# Patient Record
Sex: Female | Born: 1980 | Race: Black or African American | Hispanic: No | Marital: Single | Smoking: Former smoker
Health system: Southern US, Community
[De-identification: ages and names within clinical notes are randomized; demographics above are authoritative.]

## PROBLEM LIST (undated history)

## (undated) DIAGNOSIS — F329 Major depressive disorder, single episode, unspecified: Secondary | ICD-10-CM

## (undated) DIAGNOSIS — I10 Essential (primary) hypertension: Secondary | ICD-10-CM

## (undated) DIAGNOSIS — F419 Anxiety disorder, unspecified: Secondary | ICD-10-CM

## (undated) DIAGNOSIS — F32A Depression, unspecified: Secondary | ICD-10-CM

## (undated) HISTORY — DX: Anxiety disorder, unspecified: F41.9

## (undated) HISTORY — DX: Essential (primary) hypertension: I10

---

## 2006-10-21 ENCOUNTER — Emergency Department (HOSPITAL_COMMUNITY): Admission: EM | Admit: 2006-10-21 | Discharge: 2006-10-22 | Payer: Self-pay | Admitting: Emergency Medicine

## 2007-06-15 ENCOUNTER — Other Ambulatory Visit (HOSPITAL_COMMUNITY): Admission: RE | Admit: 2007-06-15 | Discharge: 2007-07-13 | Payer: Self-pay | Admitting: Psychiatry

## 2007-06-20 ENCOUNTER — Ambulatory Visit: Payer: Self-pay | Admitting: Psychiatry

## 2007-07-13 ENCOUNTER — Ambulatory Visit: Payer: Self-pay | Admitting: Psychiatry

## 2007-07-28 ENCOUNTER — Inpatient Hospital Stay (HOSPITAL_COMMUNITY): Admission: EM | Admit: 2007-07-28 | Discharge: 2007-07-30 | Payer: Self-pay | Admitting: *Deleted

## 2007-07-29 ENCOUNTER — Ambulatory Visit: Payer: Self-pay | Admitting: *Deleted

## 2010-07-22 NOTE — H&P (Signed)
NAME:  Nancy Mcgrath, Nancy Mcgrath NO.:  [id number]   MEDICAL RECORD NO.:  [id number]          PATIENT TYPE:  IPS   LOCATION:  0304                          FACILITY:  [facility]   PHYSICIAN:  Jasmine Pang, M.D. DATE OF BIRTH:  [birth year]   DATE OF ADMISSION:  07/28/2007  DATE OF DISCHARGE:                       PSYCHIATRIC ADMISSION ASSESSMENT   HISTORY OF PRESENT ILLNESS:  The patient presents with a history of  depression and having passive suicidal thoughts, stating that she does  not know how much more she can take of this.  Denies specific stressors  although she did state that her occupation is causing some stress for  her.  She states now that she looks back at her life she feels like she  has been depressed for years.  She reports feeling that she does not  know what to do with her life, trusting people and feels lost.  Also  feeling very vulnerable.  She reports difficulty sleeping.  She states  her brain will not shut off at night.  She denies any substance use.  She did have problems with medications recently, and states she has run  out of her medications.  She called one of her friends because she felt  her suicidal thoughts were increasing and felt unable to contract for  safety.  Had no specific plan.   PAST PSYCHIATRIC HISTORY:  First admission to the [facility] was in the IOP program for 15 days.  She has an appointment with  Dr. Milagros Evener next Friday on Aug 05, 2007.  Currently seeing a  counselor named Lottie Rater.  Has been on Wellbutrin in the past but  states that it made things worse.   SOCIAL HISTORY:  This 30 year old single female moved from [place].  She currently resides in [place].  She has no children.  She works as  a Copy at [employer].  Her family is in [place].  She moved  here to be independent.  She has a supportive friend.  Completed her GED  and then went to a community college for 1 year.  She states  she was in  a group home as a teenager for 2 years.   FAMILY HISTORY:  None.   SOCIAL HISTORY:  The patient smokes.  Denies any alcohol or drug use.  Primary care Camara Rosander is Cherylann Ratel.   MEDICAL PROBLEMS:  Was told that she had an elevated cholesterol at some  point in time but currently not on any medications.  Denies any other  acute or chronic health issues.   MEDICATIONS:  Takes Zoloft 50 mg daily, Ambien 10 mg at bedtime, and  trazodone 50.  Does get some sleep with medications.   DRUG ALLERGIES:  No known allergies.   REVIEW OF SYSTEMS:  She denies any fever, chills, chest pain, shortness  of breath.  No nausea, vomiting, dysuria.  No muscle problems.  No  falls.  No headaches.  Positive for depression, positive for anxiety.   Temperature is 97.8, 73 heart rate,  60 respirations, blood pressure is  106/70, 5 feet 2  inches tall, 147 pounds.  This is a well-nourished,  well-developed female.  HEAD:  Atraumatic.  There is no nasal drainage.  Sclerae are clear.  NECK:  No lymphadenopathy.  Neck is supple.  CHEST:  Clear, no wheezes or rales.  BREASTS:  Exam deferred.  HEART:  Regular rate and rhythm.  No murmurs, gallops or rubs.  ABDOMEN:  Soft, flat, nondistended abdomen.  Nontender.  PELVIC AND GU EXAM:  Deferred.  EXTREMITIES:  The patient moves all extremities, 5+ against resistance.  No clubbing or edema.  No deformities.  SKIN:  Warm and dry without rashes or lacerations noted.  NEUROLOGIC:  Findings are intact and nonfocal.   TSH is 2.221.  CMET within normal limits.  Urinalysis is negative.  Urine pregnancy test is negative.  CBC is within normal limits.   MENTAL STATUS EXAM:  She is a fully alert, cooperative and calm female,  casually dressed, neat in appearance.  Speech is clear, normal pace and  tone.  The patient's mood is depressed and patient is pleasant, has a  good sense of humor.  Her thought processes are coherent and goal  directed.  No active  suicidal thoughts and promises safety on the unit.  No evidence of any delusional thinking.  Cognitive function is oriented  x4.  Her memory is good.  Her judgment and insight are good.  She  appears sincere.   AXIS I:  Mood disorder, not otherwise specified.  AXIS II:  Deferred.  AXIS III:  No known acute or chronic health conditions.  AXIS IV:  Problems with occupation, other psychosocial problems.  AXIS V:  Current is 45.   PLANS:  Contract for safety.  Stabilize her mood and thinking.  Will  resume her Zoloft.  We will have Ambien available for sleep.  The  patient will need to continue with her individual therapy.  Will also  consider a family session with the patient's support group who she  identifies at this time is her friend.  Her estimated length of stay is  3-4 days.      Landry Corporal, N.P.      Jasmine Pang, M.D.  Electronically Signed    JO/MEDQ  D:  07/28/2007  T:  07/28/2007  Job:  [id number]

## 2010-07-22 NOTE — Discharge Summary (Signed)
Nancy Mcgrath, Nancy Mcgrath NO.:  [id number]   MEDICAL RECORD NO.:  [id number]          PATIENT TYPE:  IPS   LOCATION:  0304                          FACILITY:  [facility]   PHYSICIAN:  Jasmine Pang, M.D. DATE OF BIRTH:  21-Apr-1980   DATE OF ADMISSION:  07/28/2007  DATE OF DISCHARGE:  07/30/2007                               DISCHARGE SUMMARY   IDENTIFYING INFORMATION:  This is a 30 year old single African American  female who was admitted on a voluntary basis on Jul 28, 2007.   HISTORY OF PRESENT ILLNESS:  The patient has a history of depression.  She had passive suicidal ideation, do not know how much more I cannot  take of this.  She is unable to get out of bed.  She has felt depressed  for years.  She said she feels not knowing what to do with her life.  She feels vulnerable, feels lost.   PAST PSYCHIATRIC HISTORY:  This is the first [facility] admission for the  patient.  She was in IOP for 15 days, then missed 3 days and was  discharged.  She has an appointment with Dr. Evelene Croon, next week.  She has a  Veterinary surgeon, Lenoria Farrier.  She was on Wellbutrin in the past, which  made her feel worse she says.   FAMILY HISTORY:  None.   ALCOHOL AND DRUG HISTORY:  The patient smokes cigarettes.  She denies  any alcohol abuse or drug use.   MEDICAL PROBLEMS:  Increased cholesterol and no medication for this.   MEDICATION:  Zoloft 50 mg daily, Ambien 10 mg at h.s., and trazodone 50  mg at h.s.   DRUG ALLERGIES:  No known drug allergies.   PHYSICAL FINDINGS:  There were no acute physical or medical problems  noted.   ADMISSION LABORATORIES:  TSH was 2.221.  CMET was within normal limits.  Urinalysis was negative.  Urine pregnancy test negative.  CBC was within  normal limits.   HOSPITAL COURSE:  Upon admission, the patient started Zoloft 50 mg p.o.  q. day, Ambien 10 mg p.o. q.h.s., and trazodone 50 mg p.o. q.h.s.  On  Jul 29, 2007, her Zoloft was increased to 100 mg p.o.  q.a.m.  The  trazodone order was changed to p.r.n. dose.  The patient tolerated these  medications well with no significant side effects.  In individual  sessions with me, she was friendly and cooperative.  She also  participated appropriately in unit therapeutic groups and activities.  Therapeutic issues revolved around her feelings that she has been  depressed since childhood, but it had worsened recently.  She felt she  could not go home.  Stressors include work and conflict with friends.  She has a support from one friend.  She also had support from her mother  and a younger sister who lives in [place].  She had been on Chantix for  about 3 weeks, but discontinued this.  She was in the intensive  outpatient program, stopped this because she had missed 3 days and they  discharged her.  As hospitalization progressed, the patient  became less  depressed and less anxious.  As indicated above, Zoloft was increased to  100 mg daily.  The patient wanted to go home.  She had a very supportive  friend who was going away with her family for the [facility] Day weekend  and the patient wanted to go with him.  She assured me, she would be  safe.  On Jul 30, 2007, mood was less depressed and less anxious.  Affect consistent with mood.  There was no suicidal or homicidal  ideation.  No auditory or visual hallucinations.  No paranoia or  delusions.  Thoughts were logical and goal-directed.  Thought content,  no predominant theme.  Cognitive was grossly back to baseline.  The  patient wanted to leave today and was felt to be safe for discharge.   DISCHARGE DIAGNOSES:  AXIS I:  Depressive disorder, not otherwise  specified.  AXIS II:  None.  AXIS III:  Dyslipidemia.  AXIS IV:  Moderate (occupational problem, other psychosocial problems).  AXIS V:  Global assessment of functioning was 50 upon discharge.  GAF  was 40 upon admission.  GAF highest past year was 65-70.   DISCHARGE PLANS:  There are no  particular activity levels or dietary  restrictions.   POSTHOSPITAL CARE PLANS:  The patient will see Dr. Evelene Croon on Aug 05, 2007,  at 1 o'clock p.m.  She will also see Lenoria Farrier, her therapist on  Aug 04, 2007, at 2 p.m.   DISCHARGE MEDICATIONS:  1. Zoloft 100 mg p.o. q. day.  2. Ambien 10 mg p.o. q.h.s.  3. Trazodone 50 mg p.o. q.h.s. as needed for insomnia.      Jasmine Pang, M.D.  Electronically Signed     BHS/MEDQ  D:  07/30/2007  T:  07/30/2007  Job:  [id number]

## 2010-10-07 ENCOUNTER — Other Ambulatory Visit: Payer: Self-pay | Admitting: Otolaryngology

## 2010-10-07 DIAGNOSIS — H9319 Tinnitus, unspecified ear: Secondary | ICD-10-CM

## 2010-10-07 DIAGNOSIS — H905 Unspecified sensorineural hearing loss: Secondary | ICD-10-CM

## 2010-10-15 ENCOUNTER — Ambulatory Visit
Admission: RE | Admit: 2010-10-15 | Discharge: 2010-10-15 | Disposition: A | Discharge status: Home / Self Care | Payer: Medicare Other | Source: Ambulatory Visit | Attending: Otolaryngology | Admitting: Otolaryngology

## 2010-10-15 DIAGNOSIS — H905 Unspecified sensorineural hearing loss: Secondary | ICD-10-CM

## 2010-10-15 DIAGNOSIS — H9319 Tinnitus, unspecified ear: Secondary | ICD-10-CM

## 2010-10-15 MED ORDER — GADOBENATE DIMEGLUMINE 529 MG/ML IV SOLN
15.0000 mL | Freq: Once | INTRAVENOUS | Status: AC | PRN
  Administered 2010-10-15: 15 mL via INTRAVENOUS

## 2010-11-19 ENCOUNTER — Ambulatory Visit: Payer: Medicare Other | Admitting: Family Medicine

## 2010-12-03 LAB — DRUGS OF ABUSE SCREEN W/O ALC, ROUTINE URINE
Amphetamine Screen, Ur: NEGATIVE
Barbiturate Quant, Ur: NEGATIVE
Creatinine,U: 189.8
Marijuana Metabolite: NEGATIVE
Phencyclidine (PCP): NEGATIVE
Propoxyphene: NEGATIVE

## 2010-12-03 LAB — URINALYSIS, ROUTINE W REFLEX MICROSCOPIC
Bilirubin Urine: NEGATIVE
Nitrite: NEGATIVE
Specific Gravity, Urine: 1.017
Urobilinogen, UA: 1
pH: 6

## 2010-12-03 LAB — COMPREHENSIVE METABOLIC PANEL
ALT: 12
AST: 20
Alkaline Phosphatase: 42
CO2: 24
Calcium: 8.9
GFR calc Af Amer: >60
Potassium: 3.8
Sodium: 138
Total Protein: 7

## 2010-12-03 LAB — TSH: TSH: 2.221

## 2010-12-03 LAB — CBC
MCHC: 33.4
RBC: 4.42
RDW: 12.5

## 2010-12-03 LAB — PREGNANCY, URINE: Preg Test, Ur: NEGATIVE

## 2010-12-22 LAB — POCT PREGNANCY, URINE
Operator id: 27065
Preg Test, Ur: NEGATIVE

## 2011-02-23 ENCOUNTER — Emergency Department (HOSPITAL_COMMUNITY)
Admission: EM | Admit: 2011-02-23 | Discharge: 2011-02-23 | Disposition: A | Discharge status: Home / Self Care | Payer: Medicare Other | Attending: Emergency Medicine | Admitting: Emergency Medicine

## 2011-02-23 ENCOUNTER — Encounter: Payer: Self-pay | Admitting: Emergency Medicine

## 2011-02-23 DIAGNOSIS — R519 Headache, unspecified: Secondary | ICD-10-CM

## 2011-02-23 DIAGNOSIS — R51 Headache: Secondary | ICD-10-CM | POA: Insufficient documentation

## 2011-02-23 DIAGNOSIS — F17200 Nicotine dependence, unspecified, uncomplicated: Secondary | ICD-10-CM | POA: Insufficient documentation

## 2011-02-23 DIAGNOSIS — H9319 Tinnitus, unspecified ear: Secondary | ICD-10-CM | POA: Insufficient documentation

## 2011-02-23 DIAGNOSIS — H6690 Otitis media, unspecified, unspecified ear: Secondary | ICD-10-CM

## 2011-02-23 MED ORDER — AMOXICILLIN 500 MG PO CAPS: 500.0000 mg | ORAL_CAPSULE | Freq: Three times a day (TID) | ORAL | Status: AC

## 2011-02-23 MED ORDER — OXYCODONE-ACETAMINOPHEN 5-325 MG PO TABS
ORAL_TABLET | ORAL | Status: AC
  Filled 2011-02-23: qty 1

## 2011-02-23 MED ORDER — OXYCODONE-ACETAMINOPHEN 5-325 MG PO TABS
2.0000 | ORAL_TABLET | Freq: Once | ORAL | Status: AC
  Administered 2011-02-23: 2 via ORAL
  Filled 2011-02-23: qty 2

## 2011-02-23 MED ORDER — OXYCODONE-ACETAMINOPHEN 5-325 MG PO TABS: 1.0000 | ORAL_TABLET | ORAL | Status: AC | PRN

## 2011-02-23 NOTE — ED Provider Notes (Signed)
History     CSN: [id number] Arrival date & time: 02/23/2011  3:06 AM   First MD Initiated Contact with Patient 02/23/11 [phone]      Chief Complaint  Patient presents with  . Headache    (Consider location/radiation/quality/duration/timing/severity/associated sxs/prior treatment) HPI Comments: Patient is a 30 year old female with a history of intermittent right-sided facial pain. She states that currently she has a throbbing and sharp and stabbing right cheek pain. This started approximately 36 hours ago, is relatively constant, she is unsure if it makes it better or worse to chew because she has avoided eating secondary to the pain. She denies swelling of her cheek or her jaw, denies fevers, nausea, vomiting. She has no pain with opening her jaw and has no associated visual changes. She denies ataxia, focal numbness or weakness. She has had intermittent tinnitus in her left ear and has been evaluated for this by an ear nose and throat specialist as well as a neurologist. In July of this year she had a similar pain in a similar location prompting her followup visits to the specialist. 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 She had a spinal tap at the neurologist office which she reports had a normal opening pressure. Her pain in July went away spontaneously and came back yesterday. Currently her symptoms are moderate, not improved with tramadol and Naprosyn at home.  It was gradual in onset  Patient is a 30 y.o. female presenting with headaches. The history is provided by the patient and medical records.  Headache     History reviewed. No pertinent past medical history.  History reviewed. No pertinent past surgical history.  No family history on file.  History  Substance Use Topics  . Smoking status: Current Everyday Smoker  . Smokeless tobacco: Not on file  . Alcohol  Use: No    OB History    Grav Para Term Preterm Abortions TAB SAB Ect Mult Living                  Review of Systems  Neurological: Positive for headaches.  All other systems reviewed and are negative.    Allergies  Review of patient's allergies indicates no known allergies.  Home Medications   Current Outpatient Rx  Name Route Sig Dispense Refill  . BENZOCAINE 10 % MT GEL Mouth/Throat Use as directed 1 application in the mouth or throat every 2 (two) hours as needed. For dental pain     . BUPROPION HCL ER (XL) 300 MG PO TB24 Oral Take 300 mg by mouth daily.      Marland Kitchen CLONAZEPAM 0.5 MG PO TABS Oral Take 0.5 mg by mouth 3 (three) times daily as needed. For anxiety     . NAPROXEN 500 MG PO TABS Oral Take 500 mg by mouth 2 (two) times daily as needed. For pain     . TRAMADOL HCL 50 MG PO TABS Oral Take 100 mg by mouth every 6 (six) hours as needed. For pain Maximum dose= 8 tablets per day     . AMOXICILLIN 500 MG PO CAPS Oral Take 1 capsule (500 mg total) by mouth 3 (three) times daily. 30 capsule 0  . OXYCODONE-ACETAMINOPHEN 5-325 MG PO TABS Oral Take 1 tablet by mouth every 4 (four) hours as needed for pain. May take 2 tablets PO q 6 hours for severe pain -  Do not take with Tylenol as this tablet already contains tylenol 15 tablet 0    BP 128/84  Pulse 82  Temp(Src) 98.1 F (36.7 C) (Oral)  SpO2 100%  LMP 02/20/2011  Physical Exam  Nursing note and vitals reviewed. Constitutional: She appears well-developed and well-nourished. No distress.  HENT:  Head: Normocephalic and atraumatic.  Mouth/Throat: Oropharynx is clear and moist. No oropharyngeal exudate.       No trismus or torticollis.  Normal buccal mucosa, no tenderness to the soft tissues of the cheek, tenderness to the infra-zygoma maxillary area on the right. No dental disease, abscess, erythema in this area. Tympanic membrane on the right with opacification, erythema and purulent material. TM on the left normal  Eyes:  Conjunctivae and EOM are normal. Pupils are equal, round, and reactive to light. Right eye exhibits no discharge. Left eye exhibits no discharge. No scleral icterus.  Neck: Normal range of motion. Neck supple. No JVD present. No thyromegaly present.       Full range of motion of the neck without difficulty, no lymphadenopathy  Cardiovascular: Normal rate, regular rhythm, normal heart sounds and intact distal pulses.  Exam reveals no gallop and no friction rub.   No murmur heard. Pulmonary/Chest: Effort normal and breath sounds normal. No respiratory distress. She has no wheezes. She has no rales.  Abdominal: Soft. Bowel sounds are normal. She exhibits no distension and no mass. There is no tenderness.  Musculoskeletal: Normal range of motion. She exhibits no edema.  Lymphadenopathy:    She has no cervical adenopathy.  Neurological: She is alert. Coordination normal.       Neurologic exam:  Speech clear, pupils equal round reactive to light, extraocular movements intact  Normal peripheral visual fields Cranial nerves III through XII normal including no facial droop Follows commands, moves all extremities x4, normal strength to bilateral upper and lower extremities at all major muscle groups including grip Sensation normal to light touch and pinprick Coordination intact, no limb ataxia, finger-nose-finger normal Rapid alternating movements normal No pronator drift Gait normal   Skin: Skin is warm and dry. No rash noted. No erythema.  Psychiatric: She has a normal mood and affect. Her behavior is normal.    ED Course  Procedures (including critical care time)  Labs Reviewed - No data to display No results found.   1. Facial pain   2.  Otitis media   MDM  Overall the patient is well appearing with normal vital signs. She has some tenderness to the soft tissues of the right face. There is no temporomandibular joint pain and she can open her mouth without pain. She has been seen by  multiple specialists and I have recommended that she followup with both the ear nose and throat Dr. If her pain persists and with the neurologist for her repeat exam which he freely admits she did not comply with. Percocet ordered for pain, will discharge home with followup.   Prescriptions  #1 amoxicillin #2 Percocet     Vida Roller, MD 02/23/11 [phone]

## 2011-02-23 NOTE — ED Notes (Signed)
Pt very vague.  Reports that she has been having (R) sided jaw pain, radiating up to her (R) temple since last night.  Started while watching TV.  Reports having an MRI due to HAs and being told that she had too much pressure in her brain and bring given tramadol.  Pt reports using orajel for the pain which caused numbness.  Skin warm, dry and intact.  Neuro intact.

## 2011-02-23 NOTE — ED Notes (Signed)
PT. REPORTS HEADACHE WITH RIGHT CHEEK BONE/RIGHT JAW PAIN ONSET YESTERDAY.

## 2011-05-18 ENCOUNTER — Emergency Department (HOSPITAL_COMMUNITY)
Admission: EM | Admit: 2011-05-18 | Discharge: 2011-05-18 | Disposition: A | Discharge status: Home / Self Care | Payer: Medicare Other | Attending: Emergency Medicine | Admitting: Emergency Medicine

## 2011-05-18 ENCOUNTER — Encounter (HOSPITAL_COMMUNITY): Payer: Self-pay | Admitting: Emergency Medicine

## 2011-05-18 DIAGNOSIS — F329 Major depressive disorder, single episode, unspecified: Secondary | ICD-10-CM | POA: Insufficient documentation

## 2011-05-18 DIAGNOSIS — F17200 Nicotine dependence, unspecified, uncomplicated: Secondary | ICD-10-CM | POA: Insufficient documentation

## 2011-05-18 DIAGNOSIS — K047 Periapical abscess without sinus: Secondary | ICD-10-CM | POA: Insufficient documentation

## 2011-05-18 DIAGNOSIS — F3289 Other specified depressive episodes: Secondary | ICD-10-CM | POA: Insufficient documentation

## 2011-05-18 HISTORY — DX: Major depressive disorder, single episode, unspecified: F32.9

## 2011-05-18 HISTORY — DX: Depression, unspecified: F32.A

## 2011-05-18 MED ORDER — SODIUM CHLORIDE 0.9 % IV SOLN
Freq: Once | INTRAVENOUS | Status: AC
  Administered 2011-05-18: 20 mL/h via INTRAVENOUS

## 2011-05-18 MED ORDER — CLINDAMYCIN HCL 150 MG PO CAPS: 150.0000 mg | ORAL_CAPSULE | Freq: Four times a day (QID) | ORAL | Status: AC

## 2011-05-18 MED ORDER — CLINDAMYCIN PHOSPHATE 900 MG/50ML IV SOLN
900.0000 mg | Freq: Once | INTRAVENOUS | Status: AC
  Administered 2011-05-18: 900 mg via INTRAVENOUS
  Filled 2011-05-18: qty 50

## 2011-05-18 MED ORDER — HYDROCODONE-ACETAMINOPHEN 5-500 MG PO TABS: 1.0000 | ORAL_TABLET | Freq: Four times a day (QID) | ORAL | Status: AC | PRN

## 2011-05-18 NOTE — ED Provider Notes (Signed)
History     CSN: [id number]  Arrival date & time 05/18/11  0009   First MD Initiated Contact with Patient 05/18/11 0030      Chief Complaint  Patient presents with  . Dental Pain    (Consider location/radiation/quality/duration/timing/severity/associated sxs/prior treatment) HPI Comments: Patient made appointment for dentist for routine cleaning, which is on March 24 today yesterday, noticed pain in the right upper gumline.  Today, significant swelling to the cheek with dental pain  Patient is a 31 y.o. female presenting with tooth pain. The history is provided by the patient.  Dental PainThe primary symptoms include mouth pain. Primary symptoms do not include dental injury or fever. The symptoms began 2 days ago. The symptoms are worsening. The symptoms occur constantly.    Past Medical History  Diagnosis Date  . Depression     History reviewed. No pertinent past surgical history.  No family history on file.  History  Substance Use Topics  . Smoking status: Current Everyday Smoker  . Smokeless tobacco: Not on file  . Alcohol Use: No    OB History    Grav Para Term Preterm Abortions TAB SAB Ect Mult Living                  Review of Systems  Constitutional: Negative for fever and chills.  HENT: Positive for dental problem.   Musculoskeletal: Negative for myalgias.    Allergies  Review of patient's allergies indicates no known allergies.  Home Medications   Current Outpatient Rx  Name Route Sig Dispense Refill  . BENZOCAINE 10 % MT GEL Mouth/Throat Use as directed 1 application in the mouth or throat every 2 (two) hours as needed. For dental pain    . BUPROPION HCL ER (XL) 300 MG PO TB24 Oral Take 300 mg by mouth daily.      Marland Kitchen CLONAZEPAM 0.5 MG PO TABS Oral Take 0.5 mg by mouth 3 (three) times daily as needed. For anxiety     . DESOGESTREL-ETHINYL ESTRADIOL 0.15-30 MG-MCG PO TABS Oral Take 1 tablet by mouth daily.    Marland Kitchen NAPROXEN 500 MG PO TABS Oral Take 500  mg by mouth 2 (two) times daily as needed. For pain     . QUETIAPINE FUMARATE 100 MG PO TABS Oral Take 100-200 mg by mouth at bedtime.    . SERTRALINE HCL 100 MG PO TABS Oral Take 100 mg by mouth daily.    Marland Kitchen CLINDAMYCIN HCL 150 MG PO CAPS Oral Take 1 capsule (150 mg total) by mouth every 6 (six) hours. 28 capsule 0  . HYDROCODONE-ACETAMINOPHEN 5-500 MG PO TABS Oral Take 1-2 tablets by mouth every 6 (six) hours as needed for pain. 15 tablet 0    BP 131/95  Pulse 102  Temp(Src) 98.6 F (37 C) (Oral)  Resp 18  SpO2 98%  LMP 04/12/2011  Physical Exam  Constitutional: She is oriented to person, place, and time. She appears well-developed and well-nourished.  HENT:  Mouth/Throat:    Eyes: Pupils are equal, round, and reactive to light.  Neck: Normal range of motion.  Cardiovascular: Normal rate.   Abdominal: Soft.  Musculoskeletal: Normal range of motion.  Neurological: She is alert and oriented to person, place, and time.    ED Course  Procedures (including critical care time)  Labs Reviewed - No data to display No results found.   1. Dental abscess       MDM  No cavity visible on exam, but there is  significant gum swelling and tenderness with swelling of the anterior right cheek.  Will give 900 mg IV, clindamycin in the emergency department, a prescription for clindamycin and follow up with her dentist        Arman Filter, NP 05/18/11 0221

## 2011-05-18 NOTE — Discharge Instructions (Signed)
Dental Abscess A dental abscess usually starts from an infected tooth. Antibiotic medicine and pain pills can be helpful, but dental infections require the attention of a dentist. Rinse around the infected area often with salt water (a pinch of salt in 8 oz of warm water). Do not apply heat to the outside of your face. See your dentist or oral surgeon as soon as possible.  SEEK IMMEDIATE MEDICAL CARE IF:  You have increasing, severe pain that is not relieved by medicine.   You or your child has an oral temperature above 102 F (38.9 C), not controlled by medicine.   Your baby is older than 3 months with a rectal temperature of 102 F (38.9 C) or higher.   Your baby is 62 months old or younger with a rectal temperature of 100.4 F (38 C) or higher.   You develop chills, severe headache, difficulty breathing, or trouble swallowing.   You have swelling in the neck or around the eye.  Document Released: 02/23/2005 Document Revised: 02/12/2011 Document Reviewed: 08/04/2006 [facility] Patient Information 2012 [place], [place].Please take the antibiotic as directed until completed follow up with your dentist as scheduled

## 2011-05-18 NOTE — ED Notes (Signed)
C/o toothache and swelling to R side of face since Friday.

## 2011-05-18 NOTE — ED Notes (Signed)
Right chic swollen, and is painful, started on Friday and getting worst. Unable to chew.

## 2011-05-18 NOTE — ED Notes (Signed)
RX x1 given and explained to pt

## 2011-05-18 NOTE — ED Notes (Addendum)
Clindamycin infusing, site u, pt sleeping, calm, NAD.

## 2011-05-18 NOTE — ED Provider Notes (Signed)
Medical screening examination/treatment/procedure(s) were performed by non-physician practitioner and as supervising physician I was immediately available for consultation/collaboration.  Doug Sou, MD 05/18/11 [phone]

## 2012-12-30 ENCOUNTER — Emergency Department (INDEPENDENT_AMBULATORY_CARE_PROVIDER_SITE_OTHER)
Admission: EM | Admit: 2012-12-30 | Discharge: 2012-12-30 | Disposition: A | Discharge status: Home / Self Care | Payer: Medicare Other | Source: Home / Self Care

## 2012-12-30 ENCOUNTER — Encounter (HOSPITAL_COMMUNITY): Payer: Self-pay | Admitting: Emergency Medicine

## 2012-12-30 DIAGNOSIS — S0300XA Dislocation of jaw, unspecified side, initial encounter: Secondary | ICD-10-CM

## 2012-12-30 DIAGNOSIS — G44219 Episodic tension-type headache, not intractable: Secondary | ICD-10-CM

## 2012-12-30 MED ORDER — DICLOFENAC POTASSIUM 50 MG PO TABS: 50.0000 mg | ORAL_TABLET | Freq: Three times a day (TID) | ORAL | Status: DC

## 2012-12-30 MED ORDER — TRAMADOL HCL 50 MG PO TABS: 50.0000 mg | ORAL_TABLET | Freq: Four times a day (QID) | ORAL | Status: DC | PRN

## 2012-12-30 NOTE — ED Provider Notes (Signed)
CSN: [id number]     Arrival date & time 12/30/12  0831 History   First MD Initiated Contact with Patient 12/30/12 [phone]     Chief Complaint  Patient presents with  . Jaw Pain   (Consider location/radiation/quality/duration/timing/severity/associated sxs/prior Treatment) HPI Comments: C/O R jaw pain and bitemporal headaches, intermittent x 4 d. Sometimes R eye hurts.    Past Medical History  Diagnosis Date  . Depression    History reviewed. No pertinent past surgical history. History reviewed. No pertinent family history. History  Substance Use Topics  . Smoking status: Current Every Day Smoker  . Smokeless tobacco: Not on file  . Alcohol Use: No   OB History   Grav Para Term Preterm Abortions TAB SAB Ect Mult Living                 Review of Systems  Constitutional: Negative for fever, chills and activity change.  Eyes: Positive for pain.  Respiratory: Negative.   Cardiovascular: Negative.   Musculoskeletal:       As per HPI  Skin: Negative for color change, pallor and rash.  Neurological: Positive for headaches. Negative for dizziness, tremors, seizures, syncope, facial asymmetry, speech difficulty and numbness.    Allergies  Review of patient's allergies indicates no known allergies.  Home Medications   Current Outpatient Rx  Name  Route  Sig  Dispense  Refill  . benzocaine (ORAJEL) 10 % mucosal gel   Mouth/Throat   Use as directed 1 application in the mouth or throat every 2 (two) hours as needed. For dental pain         . buPROPion (WELLBUTRIN XL) 300 MG 24 hr tablet   Oral   Take 300 mg by mouth daily.           . clonazePAM (KLONOPIN) 0.5 MG tablet   Oral   Take 0.5 mg by mouth 3 (three) times daily as needed. For anxiety          . desogestrel-ethinyl estradiol (APRI,EMOQUETTE,SOLIA) 0.15-30 MG-MCG tablet   Oral   Take 1 tablet by mouth daily.         . diclofenac (CATAFLAM) 50 MG tablet   Oral   Take 1 tablet (50 mg total) by mouth 3  (three) times daily.   21 tablet   0   . QUEtiapine (SEROQUEL) 100 MG tablet   Oral   Take 100-200 mg by mouth at bedtime.         . sertraline (ZOLOFT) 100 MG tablet   Oral   Take 100 mg by mouth daily.         . traMADol (ULTRAM) 50 MG tablet   Oral   Take 1 tablet (50 mg total) by mouth every 6 (six) hours as needed for pain.   15 tablet   0    BP 148/84  Pulse 83  Temp(Src) 98 F (36.7 C)  Resp 16  SpO2 100%  LMP 12/30/2012 Physical Exam  Nursing note and vitals reviewed. Constitutional: She is oriented to person, place, and time. She appears well-developed and well-nourished. No distress.  HENT:  Right Ear: External ear normal.  Left Ear: External ear normal.  Mouth/Throat: Oropharynx is clear and moist. No oropharyngeal exudate.  Tenderness to L pterygoid muscle. No dental tenderness. No clicking with opening jaw.   Eyes: Conjunctivae and EOM are normal.  Neck: Normal range of motion. Neck supple.  Cardiovascular: Normal rate.   Pulmonary/Chest: No respiratory distress.  Neurological: She  is alert and oriented to person, place, and time. She exhibits normal muscle tone.  Skin: Skin is warm and dry. She is not diaphoretic.  Psychiatric: She has a normal mood and affect.    ED Course  Procedures (including critical care time) Labs Review Labs Reviewed - No data to display Imaging Review No results found.    MDM   1. TMJ (dislocation of temporomandibular joint), initial encounter   2. Frequent episodic tension-type headache     Cataflam and ultram as directed. F/U with your doctor. Concentrate on not clenching your jaw. Continue the ice applications since they help.  Hayden Rasmussen, NP 12/30/12 [phone]

## 2012-12-30 NOTE — ED Notes (Signed)
Jaw pain, headache behind eyes

## 2012-12-30 NOTE — ED Provider Notes (Signed)
Medical screening examination/treatment/procedure(s) were performed by a resident physician or non-physician practitioner and as the supervising physician I was immediately available for consultation/collaboration.  Evan Corey, MD   Evan S Corey, MD 12/30/12 1443 

## 2013-02-18 ENCOUNTER — Encounter (HOSPITAL_COMMUNITY): Payer: Self-pay | Admitting: Emergency Medicine

## 2013-02-18 ENCOUNTER — Emergency Department (INDEPENDENT_AMBULATORY_CARE_PROVIDER_SITE_OTHER)
Admission: EM | Admit: 2013-02-18 | Discharge: 2013-02-18 | Disposition: A | Discharge status: Home / Self Care | Payer: Medicare Other | Source: Home / Self Care

## 2013-02-18 DIAGNOSIS — K029 Dental caries, unspecified: Secondary | ICD-10-CM

## 2013-02-18 MED ORDER — DICLOFENAC POTASSIUM 50 MG PO TABS: 50.0000 mg | ORAL_TABLET | Freq: Three times a day (TID) | ORAL | Status: DC

## 2013-02-18 MED ORDER — AMOXICILLIN 500 MG PO CAPS: 500.0000 mg | ORAL_CAPSULE | Freq: Three times a day (TID) | ORAL | Status: DC

## 2013-02-18 NOTE — ED Notes (Signed)
Pt c/o persistent jaw pain/lower left dental pain.... Pain radiates to left ear Reports she was seen on 10/24 here and Dx w/TMJ Was given diclofenac and tramadol then. Denies: f/v/n/d, cold sxs.... Alert w/no signs of acute distress.

## 2013-02-18 NOTE — ED Provider Notes (Signed)
CSN: [id number]     Arrival date & time 02/18/13  1051 History   None    Chief Complaint  Patient presents with  . Jaw Pain   (Consider location/radiation/quality/duration/timing/severity/associated sxs/prior Treatment) Patient is a 32 y.o. female presenting with tooth pain. The history is provided by the patient.  Dental Pain Location:  Upper Upper teeth location:  16/LU 3rd molar Quality:  Throbbing Severity:  Moderate Onset quality:  Gradual Duration: seen 10/24, sx improved , now relapsed. Progression:  Worsening Chronicity:  Recurrent (more stress, no dental ins until jan.2015.) Context: dental caries and malocclusion     Past Medical History  Diagnosis Date  . Depression    History reviewed. No pertinent past surgical history. No family history on file. History  Substance Use Topics  . Smoking status: Current Every Day Smoker  . Smokeless tobacco: Not on file  . Alcohol Use: No   OB History   Grav Para Term Preterm Abortions TAB SAB Ect Mult Living                 Review of Systems  Constitutional: Negative.   HENT: Positive for dental problem.     Allergies  Review of patient's allergies indicates no known allergies.  Home Medications   Current Outpatient Rx  Name  Route  Sig  Dispense  Refill  . amoxicillin (AMOXIL) 500 MG capsule   Oral   Take 1 capsule (500 mg total) by mouth 3 (three) times daily.   30 capsule   0   . benzocaine (ORAJEL) 10 % mucosal gel   Mouth/Throat   Use as directed 1 application in the mouth or throat every 2 (two) hours as needed. For dental pain         . buPROPion (WELLBUTRIN XL) 300 MG 24 hr tablet   Oral   Take 300 mg by mouth daily.           . clonazePAM (KLONOPIN) 0.5 MG tablet   Oral   Take 0.5 mg by mouth 3 (three) times daily as needed. For anxiety          . desogestrel-ethinyl estradiol (APRI,EMOQUETTE,SOLIA) 0.15-30 MG-MCG tablet   Oral   Take 1 tablet by mouth daily.         . diclofenac  (CATAFLAM) 50 MG tablet   Oral   Take 1 tablet (50 mg total) by mouth 3 (three) times daily.   21 tablet   0   . diclofenac (CATAFLAM) 50 MG tablet   Oral   Take 1 tablet (50 mg total) by mouth 3 (three) times daily. For dental pain   30 tablet   0   . QUEtiapine (SEROQUEL) 100 MG tablet   Oral   Take 100-200 mg by mouth at bedtime.         . sertraline (ZOLOFT) 100 MG tablet   Oral   Take 100 mg by mouth daily.         . traMADol (ULTRAM) 50 MG tablet   Oral   Take 1 tablet (50 mg total) by mouth every 6 (six) hours as needed for pain.   15 tablet   0    BP 172/123  Pulse 80  Temp(Src) 98.5 F (36.9 C) (Oral)  Resp 18  SpO2 100%  LMP 01/21/2013 Physical Exam  Nursing note and vitals reviewed. Constitutional: She is oriented to person, place, and time. She appears well-developed and well-nourished. She appears distressed.  HENT:  Right Ear:  External ear normal.  Left Ear: External ear normal.  Mouth/Throat: Oropharynx is clear and moist and mucous membranes are normal. Dental caries present.    No definite tmj pain or crepitation appreciated on left.  Lymphadenopathy:    She has no cervical adenopathy.  Neurological: She is alert and oriented to person, place, and time.  Skin: Skin is warm and dry.    ED Course  Procedures (including critical care time) Labs Review Labs Reviewed - No data to display Imaging Review No results found.  EKG Interpretation    Date/Time:    Ventricular Rate:    PR Interval:    QRS Duration:   QT Interval:    QTC Calculation:   R Axis:     Text Interpretation:              MDM      Linna Hoff, MD 02/18/13 1332

## 2013-03-02 ENCOUNTER — Emergency Department (HOSPITAL_COMMUNITY)
Admission: EM | Admit: 2013-03-02 | Discharge: 2013-03-02 | Disposition: A | Discharge status: Home / Self Care | Payer: Medicare Other | Attending: Emergency Medicine | Admitting: Emergency Medicine

## 2013-03-02 ENCOUNTER — Emergency Department (HOSPITAL_COMMUNITY): Payer: Medicare Other

## 2013-03-02 ENCOUNTER — Encounter (HOSPITAL_COMMUNITY): Payer: Self-pay | Admitting: Emergency Medicine

## 2013-03-02 DIAGNOSIS — Z79899 Other long term (current) drug therapy: Secondary | ICD-10-CM | POA: Insufficient documentation

## 2013-03-02 DIAGNOSIS — F17200 Nicotine dependence, unspecified, uncomplicated: Secondary | ICD-10-CM | POA: Insufficient documentation

## 2013-03-02 DIAGNOSIS — K6289 Other specified diseases of anus and rectum: Secondary | ICD-10-CM | POA: Insufficient documentation

## 2013-03-02 DIAGNOSIS — K602 Anal fissure, unspecified: Secondary | ICD-10-CM

## 2013-03-02 DIAGNOSIS — K648 Other hemorrhoids: Secondary | ICD-10-CM | POA: Insufficient documentation

## 2013-03-02 DIAGNOSIS — K5900 Constipation, unspecified: Secondary | ICD-10-CM

## 2013-03-02 DIAGNOSIS — F3289 Other specified depressive episodes: Secondary | ICD-10-CM | POA: Insufficient documentation

## 2013-03-02 DIAGNOSIS — K921 Melena: Secondary | ICD-10-CM | POA: Insufficient documentation

## 2013-03-02 DIAGNOSIS — Z3202 Encounter for pregnancy test, result negative: Secondary | ICD-10-CM | POA: Insufficient documentation

## 2013-03-02 DIAGNOSIS — R110 Nausea: Secondary | ICD-10-CM | POA: Insufficient documentation

## 2013-03-02 DIAGNOSIS — F329 Major depressive disorder, single episode, unspecified: Secondary | ICD-10-CM | POA: Insufficient documentation

## 2013-03-02 DIAGNOSIS — K625 Hemorrhage of anus and rectum: Secondary | ICD-10-CM

## 2013-03-02 LAB — BASIC METABOLIC PANEL
Calcium: 9 mg/dL (ref 8.4–10.5)
GFR calc Af Amer: >90 mL/min (ref >90)
GFR calc non Af Amer: 81 mL/min — ABNORMAL LOW (ref >90)
Sodium: 138 mEq/L (ref 135–145)

## 2013-03-02 LAB — CBC WITH DIFFERENTIAL/PLATELET
Basophils Absolute: 0 10*3/uL (ref 0.0–0.1)
Eosinophils Absolute: 0.1 10*3/uL (ref 0.0–0.7)
Eosinophils Relative: 1 % (ref 0–5)
Lymphs Abs: 4 10*3/uL (ref 0.7–4.0)
MCH: 30.1 pg (ref 26.0–34.0)
MCV: 87.9 fL (ref 78.0–100.0)
Monocytes Relative: 6 % (ref 3–12)
Neutrophils Relative %: 56 % (ref 43–77)
Platelets: 313 10*3/uL (ref 150–400)
RDW: 13 % (ref 11.5–15.5)
WBC: 10.3 10*3/uL (ref 4.0–10.5)

## 2013-03-02 LAB — URINALYSIS, ROUTINE W REFLEX MICROSCOPIC
Leukocytes, UA: NEGATIVE
Protein, ur: 30 mg/dL — AB
Urobilinogen, UA: 0.2 mg/dL (ref 0.0–1.0)

## 2013-03-02 LAB — URINE MICROSCOPIC-ADD ON

## 2013-03-02 LAB — PREGNANCY, URINE: Preg Test, Ur: NEGATIVE

## 2013-03-02 MED ORDER — POTASSIUM CHLORIDE CRYS ER 20 MEQ PO TBCR
20.0000 meq | EXTENDED_RELEASE_TABLET | Freq: Once | ORAL | Status: AC
  Administered 2013-03-02: 20 meq via ORAL
  Filled 2013-03-02: qty 1

## 2013-03-02 MED ORDER — ONDANSETRON 4 MG PO TBDP
4.0000 mg | ORAL_TABLET | Freq: Once | ORAL | Status: AC
  Administered 2013-03-02: 4 mg via ORAL
  Filled 2013-03-02: qty 1

## 2013-03-02 MED ORDER — ONDANSETRON HCL 4 MG/2ML IJ SOLN: 4.0000 mg | Freq: Once | INTRAMUSCULAR | Status: DC

## 2013-03-02 MED ORDER — DOCUSATE SODIUM 100 MG PO CAPS: 100.0000 mg | ORAL_CAPSULE | Freq: Two times a day (BID) | ORAL | Status: DC

## 2013-03-02 MED ORDER — HYDROCORTISONE 2.5 % RE CREA: TOPICAL_CREAM | RECTAL | Status: DC

## 2013-03-02 MED ORDER — MAGNESIUM CITRATE PO SOLN
1.0000 | Freq: Once | ORAL | Status: AC
  Administered 2013-03-02: 1 via ORAL
  Filled 2013-03-02: qty 296

## 2013-03-02 NOTE — ED Provider Notes (Signed)
CSN: [id number]     Arrival date & time 03/02/13  1320 History   First MD Initiated Contact with Patient 03/02/13 1332     Chief Complaint  Patient presents with  . Rectal Bleeding   (Consider location/radiation/quality/duration/timing/severity/associated sxs/prior Treatment) HPI Comments: Patient here with rectal pain and constipation for the past 3 days - she states that she was able to small very hard bowel movements and pain with defecation.  She states that she had been taking metamucil for this without relief so she decided to try an enema, she reports she gave herself the enema and still felt like she had a ball of stool in her rectum so she put her fingers in to try and get some out.  She states this increased her pain and then she noticed BRBPR.  She reports now with blood rectally when she wipes and she is continuing with the constipation.  She denies fever, chills, reports no abdominal pain, diarrhea, dysuria, hematuria, vaginal discharge or bleeding.  Patient is a 32 y.o. female presenting with hematochezia. The history is provided by the patient. No language interpreter was used.  Rectal Bleeding Quality:  Bright red Amount:  Moderate Duration:  12 hours Timing:  Constant Progression:  Worsening Chronicity:  New Context: constipation, defecation and rectal pain   Context: not anal fissures, not anal penetration, not diarrhea, not foreign body, not hemorrhoids, not rectal injury and not spontaneously   Pain details:    Quality:  Sharp   Severity:  Moderate   Duration:  12 hours   Timing:  Constant   Progression:  Worsening Similar prior episodes: no   Relieved by:  Nothing Ineffective treatments:  None tried Associated symptoms: no abdominal pain, no dizziness, no epistaxis, no fever, no hematemesis, no light-headedness, no loss of consciousness, no recent illness and no vomiting   Risk factors: no anticoagulant use, no hx of colorectal cancer, no hx of IBD, no liver  disease and no NSAID use     Past Medical History  Diagnosis Date  . Depression    History reviewed. No pertinent past surgical history. History reviewed. No pertinent family history. History  Substance Use Topics  . Smoking status: Current Every Day Smoker  . Smokeless tobacco: Not on file  . Alcohol Use: No   OB History   Grav Para Term Preterm Abortions TAB SAB Ect Mult Living                 Review of Systems  Constitutional: Negative for fever.  HENT: Negative for nosebleeds.   Gastrointestinal: Positive for hematochezia. Negative for vomiting, abdominal pain and hematemesis.  Neurological: Negative for dizziness, loss of consciousness and light-headedness.  All other systems reviewed and are negative.    Allergies  Review of patient's allergies indicates no known allergies.  Home Medications   Current Outpatient Rx  Name  Route  Sig  Dispense  Refill  . Aspirin-Acetaminophen-Caffeine (GOODY HEADACHE PO)   Oral   Take 1 Package by mouth daily as needed (pain).         . psyllium (METAMUCIL) 58.6 % powder   Oral   Take 1 packet by mouth daily as needed (constipation).         Gerarda Fraction ROOT PO   Oral   Take 2 capsules by mouth at bedtime as needed (sleep).         Marland Kitchen amoxicillin (AMOXIL) 500 MG capsule   Oral   Take 1 capsule (500 mg  total) by mouth 3 (three) times daily.   30 capsule   0    BP 144/90  Pulse 74  Temp(Src) 98.2 F (36.8 C) (Oral)  Resp 16  SpO2 100%  LMP 01/21/2013 Physical Exam  Nursing note and vitals reviewed. Constitutional: She is oriented to person, place, and time. She appears well-developed and well-nourished. No distress.  HENT:  Head: Normocephalic and atraumatic.  Nose: Nose normal.  Eyes: Conjunctivae are normal. No scleral icterus.  Neck: Normal range of motion. Neck supple.  Pulmonary/Chest: Effort normal.  Abdominal: Soft. Bowel sounds are normal. She exhibits no distension. There is no tenderness. There  is no rebound and no guarding.  Genitourinary: Rectal exam shows internal hemorrhoid, fissure and tenderness. Rectal exam shows no external hemorrhoid and anal tone normal. Guaiac positive stool.  Musculoskeletal: Normal range of motion. She exhibits no edema and no tenderness.  Lymphadenopathy:    She has no cervical adenopathy.  Neurological: She is alert and oriented to person, place, and time. She exhibits normal muscle tone. Coordination normal.  Skin: Skin is warm and dry. No rash noted. No erythema. No pallor.  Psychiatric: She has a normal mood and affect. Her behavior is normal. Judgment and thought content normal.    ED Course  Procedures (including critical care time) Labs Review Labs Reviewed  BASIC METABOLIC PANEL - Abnormal; Notable for the following:    Potassium 3.3 (*)    Glucose, Bld 105 (*)    GFR calc non Af Amer 81 (*)    All other components within normal limits  OCCULT BLOOD, POC DEVICE - Abnormal; Notable for the following:    Fecal Occult Bld POSITIVE (*)    All other components within normal limits  CBC WITH DIFFERENTIAL  PREGNANCY, URINE  URINALYSIS, ROUTINE W REFLEX MICROSCOPIC   Imaging Review No results found.  EKG Interpretation   None      Results for orders placed during the hospital encounter of 03/02/13  CBC WITH DIFFERENTIAL      Result Value Range   WBC 10.3  4.0 - 10.5 K/uL   RBC 4.72  3.87 - 5.11 MIL/uL   Hemoglobin 14.2  12.0 - 15.0 g/dL   HCT 96.0  45.4 - 09.8 %   MCV 87.9  78.0 - 100.0 fL   MCH 30.1  26.0 - 34.0 pg   MCHC 34.2  30.0 - 36.0 g/dL   RDW 11.9  14.7 - 82.9 %   Platelets 313  150 - 400 K/uL   Neutrophils Relative % 56  43 - 77 %   Neutro Abs 5.7  1.7 - 7.7 K/uL   Lymphocytes Relative 38  12 - 46 %   Lymphs Abs 4.0  0.7 - 4.0 K/uL   Monocytes Relative 6  3 - 12 %   Monocytes Absolute 0.6  0.1 - 1.0 K/uL   Eosinophils Relative 1  0 - 5 %   Eosinophils Absolute 0.1  0.0 - 0.7 K/uL   Basophils Relative 0  0 - 1 %    Basophils Absolute 0.0  0.0 - 0.1 K/uL  BASIC METABOLIC PANEL      Result Value Range   Sodium 138  135 - 145 mEq/L   Potassium 3.3 (*) 3.5 - 5.1 mEq/L   Chloride 100  96 - 112 mEq/L   CO2 26  19 - 32 mEq/L   Glucose, Bld 105 (*) 70 - 99 mg/dL   BUN 11  6 - 23  mg/dL   Creatinine, Ser 7.82  0.50 - 1.10 mg/dL   Calcium 9.0  8.4 - 95.6 mg/dL   GFR calc non Af Amer 81 (*) >90 mL/min   GFR calc Af Amer >90  >90 mL/min  URINALYSIS, ROUTINE W REFLEX MICROSCOPIC      Result Value Range   Color, Urine YELLOW  YELLOW   APPearance CLOUDY (*) CLEAR   Specific Gravity, Urine 1.029  1.005 - 1.030   pH 7.0  5.0 - 8.0   Glucose, UA NEGATIVE  NEGATIVE mg/dL   Hgb urine dipstick MODERATE (*) NEGATIVE   Bilirubin Urine NEGATIVE  NEGATIVE   Ketones, ur NEGATIVE  NEGATIVE mg/dL   Protein, ur 30 (*) NEGATIVE mg/dL   Urobilinogen, UA 0.2  0.0 - 1.0 mg/dL   Nitrite NEGATIVE  NEGATIVE   Leukocytes, UA NEGATIVE  NEGATIVE  PREGNANCY, URINE      Result Value Range   Preg Test, Ur NEGATIVE  NEGATIVE  URINE MICROSCOPIC-ADD ON      Result Value Range   Squamous Epithelial / LPF RARE  RARE   WBC, UA 0-2  <3 WBC/hpf   RBC / HPF 7-10  <3 RBC/hpf   Bacteria, UA RARE  RARE   Urine-Other MUCOUS PRESENT    OCCULT BLOOD, POC DEVICE      Result Value Range   Fecal Occult Bld POSITIVE (*) NEGATIVE   Dg Abd Acute W/chest  03/02/2013   CLINICAL DATA:  Nausea and rectal bleeding  EXAM: ACUTE ABDOMEN SERIES (ABDOMEN 2 VIEW & CHEST 1 VIEW)  COMPARISON:  None.  FINDINGS: PA chest: Lungs are clear. Heart size and pulmonary vascularity are normal. No adenopathy.  Supine and upright abdomen: The bowel gas pattern is unremarkable. No obstruction or free air. No abnormal calcifications. There is moderate stool in the colon.  IMPRESSION: Bowel gas pattern unremarkable.  No edema or consolidation.   Electronically Signed   By: Bretta Bang M.D.   On: 03/02/2013 15:02     MDM  Constipation Rectal bleeding Anal  fissure  Patient is otherwise healthy 32 year old with 3 days of constipation, she tried to disimpact herself but caused rectal bleeding, no abdominal pain, no nausea or vomiting.  I will give laxative and place the patient on stool softener, and anusol suppositories for the rectal pain and bleeding.   Izola Price Marisue Humble, [place] 03/02/13 1541

## 2013-03-02 NOTE — ED Notes (Signed)
Pt sts she was constipated for a few days, gave herself an enema and then today noticed she had bright red rectal bleeding. sts she has been having pain in rectum. Denies bld in urine/vaginal bleeding. Denies UTI symptoms. Nad, skin warm and dry, resp e/u. Reports bld is after she has a BM.

## 2013-03-02 NOTE — ED Notes (Signed)
Pt returned from radiology.

## 2013-03-02 NOTE — ED Notes (Signed)
Pt c/o rectal pain x 3 days that she thought was constipation and did an enema; pt sts no results and now having some bleeding that is BRB

## 2013-03-03 NOTE — ED Provider Notes (Signed)
Medical screening examination/treatment/procedure(s) were performed by non-physician practitioner and as supervising physician I was immediately available for consultation/collaboration.  EKG Interpretation   None        Glynn Octave, MD 03/03/13 [phone]

## 2014-09-19 ENCOUNTER — Ambulatory Visit (INDEPENDENT_AMBULATORY_CARE_PROVIDER_SITE_OTHER): Payer: Medicare HMO | Admitting: Physician Assistant

## 2014-09-19 VITALS — BP 120/78 | HR 108 | Temp 98.9°F | Resp 16 | Ht 62.75 in | Wt 199.6 lb

## 2014-09-19 DIAGNOSIS — Z111 Encounter for screening for respiratory tuberculosis: Secondary | ICD-10-CM | POA: Diagnosis not present

## 2014-09-19 DIAGNOSIS — Z7185 Encounter for immunization safety counseling: Secondary | ICD-10-CM

## 2014-09-19 DIAGNOSIS — Z7189 Other specified counseling: Secondary | ICD-10-CM

## 2014-09-19 LAB — VARICELLA ZOSTER ANTIBODY, IGG: VARICELLA IGG: 1886 {index} — AB (ref <135.00)

## 2014-09-19 NOTE — Patient Instructions (Signed)
We are checking titers for mmr and varicella today.  We placed the ppd test today. Please return in 48-72 hours to have this read.

## 2014-09-19 NOTE — Progress Notes (Signed)
   Subjective:    Patient ID: Nancy Mcgrath, female    DOB: [birth year], 34 y.o.   MRN: [id number]  Chief Complaint  Patient presents with  . Immunization titers  . tb skin test   Medications, allergies, past medical history, surgical history, family history, social history and problem list reviewed and updated.  HPI  Pt presents needing ppd placed and titers drawn. 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 Brings form with her requiring vaccination records or titers for mmr, varciella, and hep b. Also requiring recent tdap vaccination. She also needs to have 2-step tb test. Denies tb sx.   She does not have any childhood vaccination records. Had tdap given yest. Had first hep b placed yest at diff clinic. Needs mmr and varicella titers today.   Review of Systems No fevers, chills.     Objective:   Physical Exam  Constitutional: She is oriented to person, place, and time.  BP 120/78 mmHg  Pulse 108  Temp(Src) 98.9 F (37.2 C) (Oral)  Resp 16  Ht 5' 2.75" (1.594 m)  Wt 199 lb 9.6 oz (90.538 kg)  BMI 35.63 kg/m2  SpO2 98%  LMP 09/01/2014   Neurological: She is alert and oriented to person, place, and time.  Psychiatric: She has a normal mood and affect. Her speech is normal and behavior is normal.      Assessment & Plan:   Immunization counseling - Plan: Rubeola antibody, IgM, Mumps antibody, IgM, Rubella Antibody, IGM, Varicella zoster antibody, IgG  Encounter for PPD test - Plan: TB Skin Test --mmr and varicella titers today --ppd placed today --forms kept in clinic, will fill out/return upon results return  Julieta Gutting, PA-C Physician Assistant-Certified [facility]  09/19/2014 10:45 AM

## 2014-09-21 ENCOUNTER — Ambulatory Visit (INDEPENDENT_AMBULATORY_CARE_PROVIDER_SITE_OTHER): Payer: Medicare HMO

## 2014-09-21 DIAGNOSIS — Z7689 Persons encountering health services in other specified circumstances: Secondary | ICD-10-CM

## 2014-09-21 DIAGNOSIS — Z111 Encounter for screening for respiratory tuberculosis: Secondary | ICD-10-CM

## 2014-09-21 LAB — TB SKIN TEST
Induration: 0 mm
TB SKIN TEST: NEGATIVE

## 2014-09-22 LAB — MUMPS ANTIBODY, IGM: Mumps IgM Value: <1:20 {titer}

## 2014-09-22 LAB — RUBEOLA ANTIBODY, IGM: Measles Ab IgM, IFA: <1:20 {titer}

## 2014-09-22 LAB — RUBELLA ANTIBODY, IGM: RUBELLA: 0.55 (ref <0.91)

## 2015-07-31 ENCOUNTER — Ambulatory Visit (INDEPENDENT_AMBULATORY_CARE_PROVIDER_SITE_OTHER): Payer: Medicare HMO | Admitting: Physician Assistant

## 2015-07-31 ENCOUNTER — Ambulatory Visit (INDEPENDENT_AMBULATORY_CARE_PROVIDER_SITE_OTHER): Payer: Medicare HMO

## 2015-07-31 VITALS — BP 128/84 | HR 86 | Temp 98.3°F | Resp 16 | Ht 62.0 in | Wt 214.0 lb

## 2015-07-31 DIAGNOSIS — M79644 Pain in right finger(s): Secondary | ICD-10-CM | POA: Diagnosis not present

## 2015-07-31 DIAGNOSIS — S62639A Displaced fracture of distal phalanx of unspecified finger, initial encounter for closed fracture: Secondary | ICD-10-CM | POA: Diagnosis not present

## 2015-07-31 MED ORDER — NAPROXEN 500 MG PO TABS: 500.0000 mg | ORAL_TABLET | Freq: Two times a day (BID) | ORAL | Status: DC

## 2015-07-31 NOTE — Progress Notes (Signed)
   07/31/2015 12:34 PM   DOB: 02/07/1981 / MRN: [id number]  SUBJECTIVE:  Nancy Mcgrath is a 35 y.o. female presenting for right fourth DIP pain and painful flexion.  Reports she was trying to catch a rack of clothes and sustained the injury a few days ago.  She feels that it is getting somewhat better.  She is not taking anything for her symptoms.     She has No Known Allergies.   She  has a past medical history of Depression and Anxiety.    She  reports that she has been smoking.  She does not have any smokeless tobacco history on file. She reports that she does not drink alcohol or use illicit drugs. She  has no sexual activity history on file. The patient  has no past surgical history on file.  Her family history is not on file.  Review of Systems  Constitutional: Negative for fever.  Musculoskeletal: Positive for joint pain. Negative for falls.  Skin: Negative for rash.  Neurological: Negative for dizziness and headaches.    Problem list and medications reviewed and updated by myself where necessary, and exist elsewhere in the encounter.   OBJECTIVE:  BP 128/84 mmHg  Pulse 86  Temp(Src) 98.3 F (36.8 C) (Oral)  Resp 16  Ht 5\' 2"  (1.575 m)  Wt 214 lb (97.07 kg)  BMI 39.13 kg/m2  SpO2 98%  LMP 07/22/2015  Physical Exam  Constitutional: She is oriented to person, place, and time. She appears well-nourished. No distress.  Eyes: EOM are normal. Pupils are equal, round, and reactive to light.  Cardiovascular: Normal rate.   Pulmonary/Chest: Effort normal.  Abdominal: She exhibits no distension.  Musculoskeletal:       Hands: Neurological: She is alert and oriented to person, place, and time. No cranial nerve deficit. Gait normal.  Skin: Skin is dry. She is not diaphoretic.  Psychiatric: She has a normal mood and affect.  Vitals reviewed.   No results found for this or any previous visit (from the past 72 hour(s)).  Dg Finger Ring Right  07/31/2015  CLINICAL DATA:  Injury. EXAM: RIGHT RING FINGER 2+V COMPARISON:  No recent prior. FINDINGS: Slight displaced fracture of the distal phalanx of the right fourth digit. No radiopaque foreign body. IMPRESSION: Slight displaced fracture of the distal phalanx of the right fourth digit. Electronically Signed   By: Maisie Fushomas  Register   On: 07/31/2015 12:13    ASSESSMENT AND PLAN  Alcario Droughtrica was seen today for finger injury.  Diagnoses and all orders for this visit:  Pain of finger of right hand -     DG Finger Ring Right; Future  Distal phalanx or phalanges, closed fracture, initial encounter: Aluminum splint provided with strict instructions to wear at all times aside from bathing.  She will return in 2-3 weeks for reimaging.  -     naproxen (NAPROSYN) 500 MG tablet; Take 1 tablet (500 mg total) by mouth 2 (two) times daily with a meal.    The patient was advised to call or return to clinic if she does not see an improvement in symptoms or to seek the care of the closest emergency department if she worsens with the above plan.   Deliah BostonMichael Arlynn Stare, MHS, PA-C [facility] 07/31/2015 12:34 PM

## 2015-07-31 NOTE — Patient Instructions (Addendum)
Take the splint off only for showering.  Return in 2-3 weeks for recheck.     IF you received an x-ray today, you will receive an invoice from [facility]. Please contact [facility] at [phone] with questions or concerns regarding your invoice.   IF you received labwork today, you will receive an invoice from [facility]. Please contact [facility] at [phone] with questions or concerns regarding your invoice.   Our billing staff will not be able to assist you with questions regarding bills from these companies.  You will be contacted with the lab results as soon as they are available. The fastest way to get your results is to activate your My Chart account. Instructions are located on the last page of this paperwork. If you have not heard from us regarding the results in 2 weeks, please contact this office.

## 2015-09-02 ENCOUNTER — Ambulatory Visit (INDEPENDENT_AMBULATORY_CARE_PROVIDER_SITE_OTHER): Payer: Medicare HMO | Admitting: Physician Assistant

## 2015-09-02 ENCOUNTER — Ambulatory Visit (INDEPENDENT_AMBULATORY_CARE_PROVIDER_SITE_OTHER): Payer: Medicare HMO

## 2015-09-02 VITALS — BP 153/95 | HR 82 | Temp 98.3°F | Resp 16 | Ht 62.5 in | Wt 221.6 lb

## 2015-09-02 DIAGNOSIS — S62639D Displaced fracture of distal phalanx of unspecified finger, subsequent encounter for fracture with routine healing: Secondary | ICD-10-CM

## 2015-09-02 DIAGNOSIS — S62634D Displaced fracture of distal phalanx of right ring finger, subsequent encounter for fracture with routine healing: Secondary | ICD-10-CM

## 2015-09-02 DIAGNOSIS — F419 Anxiety disorder, unspecified: Secondary | ICD-10-CM | POA: Insufficient documentation

## 2015-09-02 DIAGNOSIS — F32A Depression, unspecified: Secondary | ICD-10-CM | POA: Insufficient documentation

## 2015-09-02 DIAGNOSIS — M79605 Pain in left leg: Secondary | ICD-10-CM

## 2015-09-02 DIAGNOSIS — M79604 Pain in right leg: Secondary | ICD-10-CM | POA: Diagnosis not present

## 2015-09-02 DIAGNOSIS — F329 Major depressive disorder, single episode, unspecified: Secondary | ICD-10-CM | POA: Insufficient documentation

## 2015-09-02 MED ORDER — NAPROXEN 500 MG PO TABS: 500.0000 mg | ORAL_TABLET | Freq: Two times a day (BID) | ORAL | Status: DC

## 2015-09-02 NOTE — Progress Notes (Signed)
Patient ID: Nancy Mcgrath, female    DOB: 1980/03/18, 35 y.o.   MRN: [id number]  PCP: Default, Provider, MD  Subjective:   Chief Complaint  Patient presents with  . Follow-up    right ring finger fracture. Per its better but not completely   . Leg Pain    bilateral leg pain x 2 weeks     HPI Presents for evaluation of RIGHT ring finger fracture.  She presented initially on 07/31/2015 with pain in the RIGHT ring finger following injury sustained when a falling rack hyperextended the finger. Radiographs at that time revealed slightly displaced fracture of the distal phalanx of the 4th finger. She was placed in a fold-over finger splint and advised to return for re-evaluation in 2-3 weeks. Naproxen prescribed for pain.  The pain is much better, but remains present. The splint doesn't stay on well and she thinks that she may have re-injured the finger during sleep. No swelling or redness.  Reports increased pain in both lower extremities lately. This has been a chronic problem, but 2 weeks ago she couldn't get out of bed one morning due to the pain. She has been propping her legs on pillows over night and notes that she gets relief when she straightens her legs out, which feels tingly "Like blood flowing." The pain is like a muscle cramp. Not associated with swelling. Stretching makes it worse.    Review of Systems As above.    Patient Active Problem List   Diagnosis Date Noted  . Depression 09/02/2015  . Anxiety 09/02/2015     Prior to Admission medications   Medication Sig Start Date End Date Taking? Authorizing Provider  ALPRAZolam (XANAX XR) 1 MG 24 hr tablet Take 1 mg by mouth daily.   Yes Historical Provider, MD  escitalopram (LEXAPRO) 20 MG tablet Take 20 mg by mouth daily.   Yes Historical Provider, MD  naproxen (NAPROSYN) 500 MG tablet Take 1 tablet (500 mg total) by mouth 2 (two) times daily with a meal. 07/31/15  Yes Ofilia NeasMichael L Clark, PA-C  traZODone (DESYREL) 100 MG  tablet Take 100 mg by mouth at bedtime.   Yes Historical Provider, MD     No Known Allergies     Objective:  Physical Exam  Constitutional: She is oriented to person, place, and time. She appears well-developed and well-nourished. She is active and cooperative. No distress.  BP 153/95 mmHg  Pulse 82  Temp(Src) 98.3 F (36.8 C) (Oral)  Resp 16  Ht 5' 2.5" (1.588 m)  Wt 221 lb 9.6 oz (100.517 kg)  BMI 39.86 kg/m2  SpO2 97%  LMP 08/22/2015  HENT:  Head: Normocephalic and atraumatic.  Right Ear: Hearing normal.  Left Ear: Hearing normal.  Eyes: Conjunctivae are normal. No scleral icterus.  Neck: Normal range of motion. Neck supple. No thyromegaly present.  Cardiovascular: Normal rate, regular rhythm and normal heart sounds.   Pulses:      Radial pulses are 2+ on the right side, and 2+ on the left side.  Pulmonary/Chest: Effort normal and breath sounds normal.  Musculoskeletal:       Right knee: Normal.       Left knee: Normal.       Right ankle: Normal. Achilles tendon normal.       Left ankle: Normal. Achilles tendon normal.       Left hand: She exhibits normal range of motion, no tenderness, no bony tenderness, normal capillary refill, no deformity, no laceration and no  swelling. Normal sensation noted. Normal strength noted.       Right upper leg: Normal.       Left upper leg: Normal.       Right lower leg: She exhibits no tenderness, no swelling, no edema and no deformity.       Left lower leg: She exhibits no tenderness, no swelling, no edema and no deformity.       Right foot: Normal.       Left foot: Normal.  Lymphadenopathy:       Head (right side): No tonsillar, no preauricular, no posterior auricular and no occipital adenopathy present.       Head (left side): No tonsillar, no preauricular, no posterior auricular and no occipital adenopathy present.    She has no cervical adenopathy.       Right: No supraclavicular adenopathy present.       Left: No  supraclavicular adenopathy present.  Neurological: She is alert and oriented to person, place, and time. No sensory deficit.  Skin: Skin is warm, dry and intact. No rash noted. No cyanosis or erythema. Nails show no clubbing.  Psychiatric: She has a normal mood and affect. Her speech is normal and behavior is normal.       Dg Finger Ring Right  09/02/2015  CLINICAL DATA:  Followup ring finger distal phalanx fracture. EXAM: RIGHT RING FINGER 2+V COMPARISON:  07/31/2015 FINDINGS: Nondisplaced fracture of the distal phalanx of the ring finger shows increased bony resorption at the fracture site without bony bridging. No evidence of osteolysis or or periostitis. No other fractures are seen and there is no evidence of dislocation. IMPRESSION: No significant interval healing of nondisplaced distal phalanx fracture compared to prior exam. Consider continued radiographic followup in 1-2 months. Electronically Signed   By: Myles RosenthalJohn  Stahl M.D.   On: 09/02/2015 17:14       Assessment & Plan:   1. Distal phalanx or phalanges, closed fracture, with routine healing, subsequent encounter No significant change on radiographs, but clinically some better. Continue use of splint, and wrapped such that a loop around the wrist can help keep the splint in place. - DG Finger Ring Right; Future - naproxen (NAPROSYN) 500 MG tablet; Take 1 tablet (500 mg total) by mouth 2 (two) times daily with a meal.  Dispense: 60 tablet; Refill: 0  2. Leg pain, bilateral Rest. Massage. Hydration. Supportive footwear. If develops swelling, redness, increased warmth, re-evaluate.   Return in about 4 weeks (around 09/30/2015) for re-evaluation of finger fracture.    Fernande Brashelle S. Joci Dress, PA-C Physician Assistant-Certified [facility]

## 2015-09-02 NOTE — Patient Instructions (Signed)
     IF you received an x-ray today, you will receive an invoice from [facility]. Please contact [facility] at [phone] with questions or concerns regarding your invoice.   IF you received labwork today, you will receive an invoice from [facility]. Please contact [facility] at [phone] with questions or concerns regarding your invoice.   Our billing staff will not be able to assist you with questions regarding bills from these companies.  You will be contacted with the lab results as soon as they are available. The fastest way to get your results is to activate your My Chart account. Instructions are located on the last page of this paperwork. If you have not heard from us regarding the results in 2 weeks, please contact this office.      

## 2015-09-02 NOTE — Progress Notes (Signed)
Subjective:    Patient ID: Nancy Mcgrath, female    DOB: [birth year], 35 y.o.   MRN: [id number] Chief Complaint  Patient presents with  . Follow-up    right ring finger fracture. Per its better but not completely   . Leg Pain    bilateral leg pain x 2 weeks     HPI  Patient present for follow up of a fracture of her right ring finger.  Pain has improved.  Splint has continued to come off at night and she believes she may have slept on it wrong which has caused increased pain, but not to the original level.  No swelling or fever noted.  Chronic pain bilateral lower extremities after standing all day.  Has worsended lately. Two weeks ago she was unable to get out of bed because of the pain.  Has been sleeping with her legs propped on pillows.  Relieved by legs straightened, feels like "slow tingle, like blood flowing"  Sometimes feels like her calfs are tight, and like she needs to walk on her heels.  Has tried stretches that caused more pain.  Interested in knowing proper stretches to help.  EMR review: 07/31/15 - patient seen in this office for pain in her right ring finger.  She was trying to catch a rack from falling and her finger bent back.  Imaigng indicated a slightly displaced fracture of the distal phalanx of the 4th digit.  Patient given aluminum splint and instructed to wear at all times, except while bathing.  Asked to return in 2-3 weeks for repeat imaging.  Given 500mg  Naproxen BID for pain.  Review of Systems All others negative except those listed in HPI.  Patient Active Problem List   Diagnosis Date Noted  . Depression 09/02/2015  . Anxiety 09/02/2015    Current Outpatient Prescriptions on File Prior to Visit  Medication Sig Dispense Refill  . ALPRAZolam (XANAX XR) 1 MG 24 hr tablet Take 1 mg by mouth daily.    Marland Kitchen. escitalopram (LEXAPRO) 20 MG tablet Take 20 mg by mouth daily.    . naproxen (NAPROSYN) 500 MG tablet Take 1 tablet (500 mg total) by mouth 2 (two) times  daily with a meal. 30 tablet 0  . traZODone (DESYREL) 100 MG tablet Take 100 mg by mouth at bedtime.     No current facility-administered medications on file prior to visit.   No Known Allergies      Objective: BP 153/95 mmHg  Pulse 82  Temp(Src) 98.3 F (36.8 C) (Oral)  Resp 16  Ht 5' 2.5" (1.588 m)  Wt 221 lb 9.6 oz (100.517 kg)  BMI 39.86 kg/m2  SpO2 97%  LMP 08/22/2015   Physical Exam  Constitutional: She appears well-developed and well-nourished.  Neck: Normal range of motion. Neck supple. No tracheal deviation present. No thyromegaly present.  Cardiovascular: Normal rate, regular rhythm, normal heart sounds and intact distal pulses.   Pulses:      Radial pulses are 2+ on the right side, and 2+ on the left side.       Dorsalis pedis pulses are 2+ on the right side, and 2+ on the left side.       Posterior tibial pulses are 2+ on the right side, and 2+ on the left side.  Pulmonary/Chest: Effort normal and breath sounds normal. No respiratory distress.  Musculoskeletal:       Right knee: She exhibits normal range of motion and no swelling.  Left knee: She exhibits normal range of motion and no swelling. No tenderness found.       Right ankle: She exhibits normal range of motion and no swelling.       Left ankle: She exhibits normal range of motion and no swelling. No tenderness.       Hands:      Legs:   Dg Finger Ring Right  09/02/2015  CLINICAL DATA:  Followup ring finger distal phalanx fracture. EXAM: RIGHT RING FINGER 2+V COMPARISON:  07/31/2015 FINDINGS: Nondisplaced fracture of the distal phalanx of the ring finger shows increased bony resorption at the fracture site without bony bridging. No evidence of osteolysis or or periostitis. No other fractures are seen and there is no evidence of dislocation. IMPRESSION: No significant interval healing of nondisplaced distal phalanx fracture compared to prior exam. Consider continued radiographic followup in 1-2 months.  Electronically Signed   By: Myles RosenthalJohn  Stahl M.D.   On: 09/02/2015 17:14      Assessment & Plan:  1. Distal phalanx or phalanges, closed fracture, with routine healing, subsequent encounter  Discussed imaging with patient which indicated fracture has not had significant healing.    Applied new splint and instructed patient on how to properly secure splint so it would not come off in her sleep.   Refilled Naproxen for pain.  Patient instructed to follow up in 1 month for re-imaging of right 4th finger. - DG Finger Ring Right; Future - naproxen (NAPROSYN) 500 MG tablet; Take 1 tablet (500 mg total) by mouth 2 (two) times daily with a meal.  Dispense: 60 tablet; Refill: 0  2. Leg pain, bilateral  Pain is likely due to prolonged dependency.   Discussed management options with patient including proper footwear, using orthotics, going for a massage and requesting focus on calves, taking a bath in epsom salts with ecupulus or peppermint, or applying peppermint oil and massaging it into her calves.   Patient instructed to return to office if pain worsens or if any questions and concerns arise.  Otherwise follow up in 1 month.  Ryver Zadrozny P. Antigone Crowell.

## 2015-10-11 ENCOUNTER — Ambulatory Visit (INDEPENDENT_AMBULATORY_CARE_PROVIDER_SITE_OTHER): Payer: Medicare HMO

## 2015-10-11 ENCOUNTER — Ambulatory Visit: Payer: Medicare HMO | Admitting: Physician Assistant

## 2015-10-11 ENCOUNTER — Encounter: Payer: Self-pay | Admitting: Physician Assistant

## 2015-10-11 VITALS — BP 160/110 | HR 92 | Temp 98.3°F | Resp 16 | Ht 62.0 in | Wt 226.4 lb

## 2015-10-11 DIAGNOSIS — S62609K Fracture of unspecified phalanx of unspecified finger, subsequent encounter for fracture with nonunion: Secondary | ICD-10-CM

## 2015-10-11 DIAGNOSIS — M79644 Pain in right finger(s): Secondary | ICD-10-CM

## 2015-10-11 DIAGNOSIS — Z8781 Personal history of (healed) traumatic fracture: Secondary | ICD-10-CM

## 2015-10-11 DIAGNOSIS — Z720 Tobacco use: Secondary | ICD-10-CM | POA: Diagnosis not present

## 2015-10-11 DIAGNOSIS — S62604K Fracture of unspecified phalanx of right ring finger, subsequent encounter for fracture with nonunion: Secondary | ICD-10-CM | POA: Diagnosis not present

## 2015-10-11 MED ORDER — TRAMADOL HCL 50 MG PO TABS: 50.0000 mg | ORAL_TABLET | Freq: Three times a day (TID) | ORAL | 0 refills | Status: DC | PRN

## 2015-10-11 NOTE — Progress Notes (Signed)
Nancy Mcgrath  MRN: [id number] DOB: [birth year]  PCP: Default, Provider, MD  Subjective:  Pt presents to clinic for follow-up right finger fracture.   Patient initially presented to clinic on 07/31/2015 for right fourth finger pain after a clothes rack fell and she tried to catch it, hyperextending the finger. Initial Xray showed slightly displaced fracture of distal phalanx of fourth finger. She was placed in a U-splint at that visit and was asked to RTC in one month.  Follow-up appt on 09/02/2015 xray showed no significant change. She was asked to RTC for f/u xray in one month.   Today, she states her finger feels a little better, but she still has 4-5/10 pain when using finger. 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  She would like another splint as her current one smells and the spongy cushion is wearing thin. She takes Tramadol at night before bed when needed, which helps her pain. Says she recently lost her pain medication and hopes she can have more.   She smokes a cigarettes, 1 ppd.    Review of Systems  Musculoskeletal: Negative for arthralgias and joint swelling.  Skin: Negative for color change.  Neurological: Negative for numbness.    Patient Active Problem List   Diagnosis Date Noted  . Depression 09/02/2015  . Anxiety 09/02/2015    Current Outpatient Prescriptions on File Prior to Visit  Medication Sig Dispense Refill  . ALPRAZolam (XANAX XR) 1 MG 24 hr tablet Take 1 mg by mouth daily.    Marland Kitchen escitalopram (LEXAPRO) 20 MG tablet Take 20 mg by mouth daily.    . naproxen (NAPROSYN) 500 MG tablet Take 1 tablet (500 mg total) by mouth 2 (two) times daily with a meal. 60 tablet 0   No current facility-administered medications on file prior to visit.     No Known Allergies  Objective:  BP (!) 160/110 (BP Location: Left Arm, Patient Position: Sitting, Cuff Size: Large)   Pulse 92   Temp 98.3 F (36.8 C) (Oral)   Resp 16   Ht 5'  2" (1.575 m)   Wt 226 lb 6.4 oz (102.7 kg)   LMP 09/11/2015   BMI 41.41 kg/m   Physical Exam  Constitutional: She is oriented to person, place, and time and well-developed, well-nourished, and in no distress. No distress.  HENT:  Head: Normocephalic and atraumatic.  Cardiovascular: Normal rate, regular rhythm and normal heart sounds.   Musculoskeletal:       Right hand: She exhibits decreased range of motion (DIP with passive flexion), tenderness (pain with flexion against resistance) and bony tenderness (TTP distal phalanx and DIP). She exhibits normal capillary refill, no deformity and no swelling. Normal sensation noted. Decreased strength noted.  Neurological: She is alert and oriented to person, place, and time. GCS score is 15.  Skin: Skin is warm and dry. No erythema.  Psychiatric: Mood, memory, affect and judgment normal.  Vitals reviewed.   Dg Finger Ring Right  Result Date: 10/11/2015 CLINICAL DATA:  Known fracture fourth distal phalanx. Persistent pain EXAM: RIGHT FOURTH FINGER 2+V COMPARISON:  September 02, 2015 FINDINGS: Frontal, oblique, and lateral views obtained. The fracture of the fourth distal phalanx is again noted without appreciable change in alignment. There is no appreciable callus formation in this area compared to prior study. No new fracture. No dislocation. Joint spaces appear normal. IMPRESSION: Fracture of the midportion of the fourth distal phalanx appears unchanged in alignment. Essentially no  callus formation seen to indicate healing. No new fracture. No dislocation. No apparent arthropathy. These results will be called to the ordering clinician or representative by the Radiologist Assistant, and communication documented in the PACS or zVision Dashboard. Electronically Signed   By: Bretta Bang III M.D.   On: 10/11/2015 11:47    Assessment and Plan :  1. Finger pain, right - traMADol (ULTRAM) 50 MG tablet; Take 1 tablet (50 mg total) by mouth every 8 (eight)  hours as needed.  Dispense: 30 tablet; Refill: 0  2. History of fracture of finger - DG Finger Ring Right; Future  3. Finger fracture, right, with nonunion, subsequent encounter - Ambulatory referral to Orthopedic Surgery due to this nonhealing nature of this fracture.  - Apply finger splint static. New splint applied as she is reluctant to wear her original one due to smell and wear & tear.   4. Tobacco abuse Patient education about possible delayed healing due to smoking (1ppd). Smoking cessation information provided.Marco Collie, PA-C  [facility] 10/11/2015 11:37 AM

## 2015-10-11 NOTE — Patient Instructions (Addendum)
IF you received an x-ray today, you will receive an invoice from [facility]. Please contact [facility] at [phone] with questions or concerns regarding your invoice.   IF you received labwork today, you will receive an invoice from [employer]. Please contact [facility] at [phone] with questions or concerns regarding your invoice.   Our billing staff will not be able to assist you with questions regarding bills from these companies.  You will be contacted with the lab results as soon as they are available. The fastest way to get your results is to activate your My Chart account. Instructions are located on the last page of this paperwork. If you have not heard from [place] regarding the results in 2 weeks, please contact this office.    Smoking Cessation, Tips for Success If you are ready to quit smoking, congratulations! You have chosen to help yourself be healthier. Cigarettes bring nicotine, tar, carbon monoxide, and other irritants into your body. Your lungs, heart, and blood vessels will be able to work better without these poisons. There are many different ways to quit smoking. Nicotine gum, nicotine patches, a nicotine inhaler, or nicotine nasal spray can help with physical craving. Hypnosis, support groups, and medicines help break the habit of smoking. WHAT THINGS CAN I DO TO MAKE QUITTING EASIER?  Here are some tips to help you quit for good:  Pick a date when you will quit smoking completely. Tell all of your friends and family about your plan to quit on that date.  Do not try to slowly cut down on the number of cigarettes you are smoking. Pick a quit date and quit smoking completely starting on that day.  Throw away all cigarettes.   Clean and remove all ashtrays from your home, work, and car.  On a card, write down your reasons for quitting. Carry the card with you and read it when you get the urge to smoke.  Cleanse your  body of nicotine. Drink enough water and fluids to keep your urine clear or pale yellow. Do this after quitting to flush the nicotine from your body.  Learn to predict your moods. Do not let a bad situation be your excuse to have a cigarette. Some situations in your life might tempt you into wanting a cigarette.  Never have "just one" cigarette. It leads to wanting another and another. Remind yourself of your decision to quit.  Change habits associated with smoking. If you smoked while driving or when feeling stressed, try other activities to replace smoking. Stand up when drinking your coffee. Brush your teeth after eating. Sit in a different chair when you read the paper. Avoid alcohol while trying to quit, and try to drink fewer caffeinated beverages. Alcohol and caffeine may urge you to smoke.  Avoid foods and drinks that can trigger a desire to smoke, such as sugary or spicy foods and alcohol.  Ask people who smoke not to smoke around you.  Have something planned to do right after eating or having a cup of coffee. For example, plan to take a walk or exercise.  Try a relaxation exercise to calm you down and decrease your stress. Remember, you may be tense and nervous for the first 2 weeks after you quit, but this will pass.  Find new activities to keep your hands busy. Play with a pen, coin, or rubber band. Doodle or draw things on paper.  Brush your teeth right after eating. This will help  cut down on the craving for the taste of tobacco after meals. You can also try mouthwash.   Use oral substitutes in place of cigarettes. Try using lemon drops, carrots, cinnamon sticks, or chewing gum. Keep them handy so they are available when you have the urge to smoke.  When you have the urge to smoke, try deep breathing.  Designate your home as a nonsmoking area.  If you are a heavy smoker, ask your health care provider about a prescription for nicotine chewing gum. It can ease your withdrawal  from nicotine.  Reward yourself. Set aside the cigarette money you save and buy yourself something nice.  Look for support from others. Join a support group or smoking cessation program. Ask someone at home or at work to help you with your plan to quit smoking.  Always ask yourself, "Do I need this cigarette or is this just a reflex?" Tell yourself, "Today, I choose not to smoke," or "I do not want to smoke." You are reminding yourself of your decision to quit.  Do not replace cigarette smoking with electronic cigarettes (commonly called e-cigarettes). The safety of e-cigarettes is unknown, and some may contain harmful chemicals.  If you relapse, do not give up! Plan ahead and think about what you will do the next time you get the urge to smoke. HOW WILL I FEEL WHEN I QUIT SMOKING? You may have symptoms of withdrawal because your body is used to nicotine (the addictive substance in cigarettes). You may crave cigarettes, be irritable, feel very hungry, cough often, get headaches, or have difficulty concentrating. The withdrawal symptoms are only temporary. They are strongest when you first quit but will go away within 10-14 days. When withdrawal symptoms occur, stay in control. Think about your reasons for quitting. Remind yourself that these are signs that your body is healing and getting used to being without cigarettes. Remember that withdrawal symptoms are easier to treat than the major diseases that smoking can cause.  Even after the withdrawal is over, expect periodic urges to smoke. However, these cravings are generally short lived and will go away whether you smoke or not. Do not smoke! WHAT RESOURCES ARE AVAILABLE TO HELP ME QUIT SMOKING? Your health care provider can direct you to community resources or hospitals for support, which may include:  Group support.  Education.  Hypnosis.  Therapy.   This information is not intended to replace advice given to you by your health care  provider. Make sure you discuss any questions you have with your health care provider.   Document Released: 11/22/2003 Document Revised: 03/16/2014 Document Reviewed: 08/11/2012 Elsevier Interactive Patient Education 2016 [employer]  Finger Fracture Finger fractures are breaks in the bones of the fingers. There are many types of fractures. There are also different ways of treating these fractures. Your doctor will talk with you about the best way to treat your fracture. Injury is the main cause of broken fingers. This includes:  Injuries while playing sports.  Workplace injuries.  Falls. HOME CARE  Follow your doctor's instructions for:  Activities.  Exercises.  Physical therapy.  Take medicines only as told by your doctor for pain, discomfort, or fever. GET HELP IF: You have pain or swelling that limits:  The motion of your fingers.  The use of your fingers. GET HELP RIGHT AWAY IF:  You cannot feel your fingers, or your fingers become numb.   This information is not intended to replace advice given to you by your  health care provider. Make sure you discuss any questions you have with your health care provider.   Document Released: 08/12/2007 Document Revised: 03/16/2014 Document Reviewed: 10/05/2012 Elsevier Interactive Patient Education [employer]

## 2015-10-24 ENCOUNTER — Ambulatory Visit (INDEPENDENT_AMBULATORY_CARE_PROVIDER_SITE_OTHER): Payer: Medicare HMO | Admitting: Physician Assistant

## 2015-10-24 VITALS — BP 154/88 | HR 85 | Temp 98.2°F | Resp 16 | Ht 63.5 in | Wt 224.8 lb

## 2015-10-24 DIAGNOSIS — I10 Essential (primary) hypertension: Secondary | ICD-10-CM | POA: Diagnosis not present

## 2015-10-24 DIAGNOSIS — Z131 Encounter for screening for diabetes mellitus: Secondary | ICD-10-CM

## 2015-10-24 DIAGNOSIS — F4541 Pain disorder exclusively related to psychological factors: Secondary | ICD-10-CM

## 2015-10-24 DIAGNOSIS — Z720 Tobacco use: Secondary | ICD-10-CM | POA: Diagnosis not present

## 2015-10-24 LAB — COMPLETE METABOLIC PANEL WITH GFR
ALT: 8 U/L (ref 6–29)
AST: 14 U/L (ref 10–30)
Albumin: 4 g/dL (ref 3.6–5.1)
Alkaline Phosphatase: 54 U/L (ref 33–115)
BUN: 7 mg/dL (ref 7–25)
CO2: 24 mmol/L (ref 20–31)
Chloride: 105 mmol/L (ref 98–110)
Creat: 0.99 mg/dL (ref 0.50–1.10)
GFR, Est African American: 87 mL/min (ref >=60)
Glucose, Bld: 96 mg/dL (ref 65–99)
Sodium: 137 mmol/L (ref 135–146)
Total Bilirubin: 0.6 mg/dL (ref 0.2–1.2)

## 2015-10-24 LAB — GLUCOSE, POCT (MANUAL RESULT ENTRY): POC Glucose: 101 mg/dL — AB (ref 70–99)

## 2015-10-24 LAB — LIPID PANEL
Cholesterol: 200 mg/dL (ref 125–200)
HDL: 44 mg/dL — ABNORMAL LOW (ref >=46)
LDL Cholesterol: 138 mg/dL — ABNORMAL HIGH (ref <130)
Total CHOL/HDL Ratio: 4.5 ratio (ref <=5.0)
Triglycerides: 89 mg/dL (ref <150)
VLDL: 18 mg/dL (ref <30)

## 2015-10-24 LAB — COMPLETE METABOLIC PANEL WITHOUT GFR
Calcium: 9.1 mg/dL (ref 8.6–10.2)
GFR, Est Non African American: 76 mL/min (ref >=60)
Potassium: 3.8 mmol/L (ref 3.5–5.3)
Total Protein: 7.2 g/dL (ref 6.1–8.1)

## 2015-10-24 MED ORDER — AMLODIPINE BESYLATE 10 MG PO TABS: 10.0000 mg | ORAL_TABLET | Freq: Every day | ORAL | 3 refills | Status: DC

## 2015-10-24 MED ORDER — IBUPROFEN 600 MG PO TABS: 600.0000 mg | ORAL_TABLET | Freq: Three times a day (TID) | ORAL | 0 refills | Status: DC | PRN

## 2015-10-24 NOTE — Progress Notes (Signed)
Nancy Mcgrath  MRN: [id number] DOB: 1980-05-27  PCP: Default, Provider, MD  Subjective:  Pt is a 35 year old obese female, history of depression, anxiety and tobacco abuse, presents to clinic for high blood pressure.  Her readings have been high during her last few visits where providers encouraged her to come in and be seen for her blood pressure.  Today her BP is 154/88. No PCP. No history of hypertension.   She works on the weekends as a Museum/gallery conservatorvet tech. During the week she "just hangs out". Does not exercise.  Has a poor diet: Drinks Pepsi every day up to 4L, fast food, junk food, chips, cookies, soda. Chicken. Smoker 1.5 packs/day x 15 years.   Unsure of family medical history, she states "we just don't talk about it".   Review of Systems  Constitutional: Negative.   Respiratory: Negative.   Cardiovascular: Negative.   Gastrointestinal: Negative.     Patient Active Problem List   Diagnosis Date Noted  . Depression 09/02/2015  . Anxiety 09/02/2015    Current Outpatient Prescriptions on File Prior to Visit  Medication Sig Dispense Refill  . ALPRAZolam (XANAX XR) 1 MG 24 hr tablet Take 1 mg by mouth daily.    Marland Kitchen. escitalopram (LEXAPRO) 20 MG tablet Take 20 mg by mouth daily.    . naproxen (NAPROSYN) 500 MG tablet Take 1 tablet (500 mg total) by mouth 2 (two) times daily with a meal. 60 tablet 0  . traMADol (ULTRAM) 50 MG tablet Take 1 tablet (50 mg total) by mouth every 8 (eight) hours as needed. (Patient not taking: Reported on 10/24/2015) 30 tablet 0   No current facility-administered medications on file prior to visit.     No Known Allergies  Objective:  BP (!) 154/88   Pulse 85   Temp 98.2 F (36.8 C) (Oral)   Resp 16   Ht 5' 3.5" (1.613 m)   Wt 224 lb 12.8 oz (102 kg)   LMP 09/11/2015   SpO2 (!) 85%   BMI 39.20 kg/m   Physical Exam  Constitutional: She is oriented to person, place, and time and well-developed, well-nourished, and in no distress. No distress.    Cardiovascular: Normal rate, regular rhythm and intact distal pulses.  Exam reveals friction rub.   No murmur heard. Abdominal: Soft. Bowel sounds are normal. There is no tenderness.  Neurological: She is alert and oriented to person, place, and time. GCS score is 15.  Skin: Skin is warm and dry.  Psychiatric: Mood, memory, affect and judgment normal.   Results for orders placed or performed in visit on 10/24/15  POCT glucose (manual entry)  Result Value Ref Range   POC Glucose 101 (A) 70 - 99 mg/dl    Assessment and Plan :  1. Essential hypertension - Lipid panel - COMPLETE METABOLIC PANEL WITH GFR - Ambulatory referral to Nutrition and Diabetic Education - amLODipine (NORVASC) 10 MG tablet; Take 1 tablet (10 mg total) by mouth daily.  Dispense: 90 tablet; Refill: 3  2. Tobacco abuse 3. Screening for diabetes mellitus (DM) - POCT glucose (manual entry)  4. Stress headaches - ibuprofen (ADVIL,MOTRIN) 600 MG tablet; Take 1 tablet (600 mg total) by mouth every 8 (eight) hours as needed.  Dispense: 30 tablet; Refill: 0   - Newly diagnosed HTN, started Norvasc 10mg .  Diet, exercise and smoking cessation discussed with patient, educational materials printed out and given to patient. She understands the need to change, however states her anxiety keeps her  from making permanent lifestyle changes.  Referral made to diet and nutrition. She will follow up in two weeks for BP check.     Whitney Zale Marcotte, PA-Marco Collie  [facility] 10/24/2015 11:42 AM

## 2015-10-24 NOTE — Patient Instructions (Addendum)
IF you received an x-ray today, you will receive an invoice from [facility]. Please contact [facility] at [phone] with questions or concerns regarding your invoice.   IF you received labwork today, you will receive an invoice from [employer]. Please contact [facility] at [phone] with questions or concerns regarding your invoice.   Our billing staff will not be able to assist you with questions regarding bills from these companies.  You will be contacted with the lab results as soon as they are available. The fastest way to get your results is to activate your My Chart account. Instructions are located on the last page of this paperwork. If you have not heard from [place] regarding the results in 2 weeks, please contact this office.      Hypertension Hypertension, commonly called high blood pressure, is when the force of blood pumping through your arteries is too strong. Your arteries are the blood vessels that carry blood from your heart throughout your body. A blood pressure reading consists of a higher number over a lower number, such as 110/72. The higher number (systolic) is the pressure inside your arteries when your heart pumps. The lower number (diastolic) is the pressure inside your arteries when your heart relaxes. Ideally you want your blood pressure below 120/80. Hypertension forces your heart to work harder to pump blood. Your arteries may become narrow or stiff. Having untreated or uncontrolled hypertension can cause heart attack, stroke, kidney disease, and other problems. RISK FACTORS Some risk factors for high blood pressure are controllable. Others are not.  Risk factors you cannot control include:   Race. You may be at higher risk if you are African American.  Age. Risk increases with age.  Gender. Men are at higher risk than women before age 68 years. After age 58, women are at higher risk than men. Risk factors you  can control include:  Not getting enough exercise or physical activity.  Being overweight.  Getting too much fat, sugar, calories, or salt in your diet.  Drinking too much alcohol. SIGNS AND SYMPTOMS Hypertension does not usually cause signs or symptoms. Extremely high blood pressure (hypertensive crisis) may cause headache, anxiety, shortness of breath, and nosebleed. DIAGNOSIS To check if you have hypertension, your health care provider will measure your blood pressure while you are seated, with your arm held at the level of your heart. It should be measured at least twice using the same arm. Certain conditions can cause a difference in blood pressure between your right and left arms. A blood pressure reading that is higher than normal on one occasion does not mean that you need treatment. If it is not clear whether you have high blood pressure, you may be asked to return on a different day to have your blood pressure checked again. Or, you may be asked to monitor your blood pressure at home for 1 or more weeks. TREATMENT Treating high blood pressure includes making lifestyle changes and possibly taking medicine. Living a healthy lifestyle can help lower high blood pressure. You may need to change some of your habits. Lifestyle changes may include:  Following the DASH diet. This diet is high in fruits, vegetables, and whole grains. It is low in salt, red meat, and added sugars.  Keep your sodium intake below 2,300 mg per day.  Getting at least 30-45 minutes of aerobic exercise at least 4 times per week.  Losing weight if necessary.  Not smoking.  Limiting alcoholic  beverages.  Learning ways to reduce stress. Your health care provider may prescribe medicine if lifestyle changes are not enough to get your blood pressure under control, and if one of the following is true:  You are 5318-33 years of age and your systolic blood pressure is above 140.  You are 33 years of age or older, and  your systolic blood pressure is above 150.  Your diastolic blood pressure is above 90.  You have diabetes, and your systolic blood pressure is over 140 or your diastolic blood pressure is over 90.  You have kidney disease and your blood pressure is above 140/90.  You have heart disease and your blood pressure is above 140/90. Your personal target blood pressure may vary depending on your medical conditions, your age, and other factors. HOME CARE INSTRUCTIONS  Have your blood pressure rechecked as directed by your health care provider.   Take medicines only as directed by your health care provider. Follow the directions carefully. Blood pressure medicines must be taken as prescribed. The medicine does not work as well when you skip doses. Skipping doses also puts you at risk for problems.  Do not smoke.   Monitor your blood pressure at home as directed by your health care provider. SEEK MEDICAL CARE IF:   You think you are having a reaction to medicines taken.  You have recurrent headaches or feel dizzy.  You have swelling in your ankles.  You have trouble with your vision. SEEK IMMEDIATE MEDICAL CARE IF:  You develop a severe headache or confusion.  You have unusual weakness, numbness, or feel faint.  You have severe chest or abdominal pain.  You vomit repeatedly.  You have trouble breathing. MAKE SURE YOU:   Understand these instructions.  Will watch your condition.  Will get help right away if you are not doing well or get worse.   This information is not intended to replace advice given to you by your health care provider. Make sure you discuss any questions you have with your health care provider.   Document Released: 02/23/2005 Document Revised: 07/10/2014 Document Reviewed: 12/16/2012 Elsevier Interactive Patient Education 2016 [employer] Smoking Cessation, Tips for Success If you are ready to quit smoking, congratulations! You have chosen to help  yourself be healthier. Cigarettes bring nicotine, tar, carbon monoxide, and other irritants into your body. Your lungs, heart, and blood vessels will be able to work better without these poisons. There are many different ways to quit smoking. Nicotine gum, nicotine patches, a nicotine inhaler, or nicotine nasal spray can help with physical craving. Hypnosis, support groups, and medicines help break the habit of smoking. WHAT THINGS CAN I DO TO MAKE QUITTING EASIER?  Here are some tips to help you quit for good:  Pick a date when you will quit smoking completely. Tell all of your friends and family about your plan to quit on that date.  Do not try to slowly cut down on the number of cigarettes you are smoking. Pick a quit date and quit smoking completely starting on that day.  Throw away all cigarettes.   Clean and remove all ashtrays from your home, work, and car.  On a card, write down your reasons for quitting. Carry the card with you and read it when you get the urge to smoke.  Cleanse your body of nicotine. Drink enough water and fluids to keep your urine clear or pale yellow. Do this after quitting to flush the nicotine from your body.  Learn to predict your moods. Do not let a bad situation be your excuse to have a cigarette. Some situations in your life might tempt you into wanting a cigarette.  Never have "just one" cigarette. It leads to wanting another and another. Remind yourself of your decision to quit.  Change habits associated with smoking. If you smoked while driving or when feeling stressed, try other activities to replace smoking. Stand up when drinking your coffee. Brush your teeth after eating. Sit in a different chair when you read the paper. Avoid alcohol while trying to quit, and try to drink fewer caffeinated beverages. Alcohol and caffeine may urge you to smoke.  Avoid foods and drinks that can trigger a desire to smoke, such as sugary or spicy foods and  alcohol.  Ask people who smoke not to smoke around you.  Have something planned to do right after eating or having a cup of coffee. For example, plan to take a walk or exercise.  Try a relaxation exercise to calm you down and decrease your stress. Remember, you may be tense and nervous for the first 2 weeks after you quit, but this will pass.  Find new activities to keep your hands busy. Play with a pen, coin, or rubber band. Doodle or draw things on paper.  Brush your teeth right after eating. This will help cut down on the craving for the taste of tobacco after meals. You can also try mouthwash.   Use oral substitutes in place of cigarettes. Try using lemon drops, carrots, cinnamon sticks, or chewing gum. Keep them handy so they are available when you have the urge to smoke.  When you have the urge to smoke, try deep breathing.  Designate your home as a nonsmoking area.  If you are a heavy smoker, ask your health care provider about a prescription for nicotine chewing gum. It can ease your withdrawal from nicotine.  Reward yourself. Set aside the cigarette money you save and buy yourself something nice.  Look for support from others. Join a support group or smoking cessation program. Ask someone at home or at work to help you with your plan to quit smoking.  Always ask yourself, "Do I need this cigarette or is this just a reflex?" Tell yourself, "Today, I choose not to smoke," or "I do not want to smoke." You are reminding yourself of your decision to quit.  Do not replace cigarette smoking with electronic cigarettes (commonly called e-cigarettes). The safety of e-cigarettes is unknown, and some may contain harmful chemicals.  If you relapse, do not give up! Plan ahead and think about what you will do the next time you get the urge to smoke. HOW WILL I FEEL WHEN I QUIT SMOKING? You may have symptoms of withdrawal because your body is used to nicotine (the addictive substance in  cigarettes). You may crave cigarettes, be irritable, feel very hungry, cough often, get headaches, or have difficulty concentrating. The withdrawal symptoms are only temporary. They are strongest when you first quit but will go away within 10-14 days. When withdrawal symptoms occur, stay in control. Think about your reasons for quitting. Remind yourself that these are signs that your body is healing and getting used to being without cigarettes. Remember that withdrawal symptoms are easier to treat than the major diseases that smoking can cause.  Even after the withdrawal is over, expect periodic urges to smoke. However, these cravings are generally short lived and will go away whether you smoke or  not. Do not smoke! WHAT RESOURCES ARE AVAILABLE TO HELP ME QUIT SMOKING? Your health care provider can direct you to community resources or hospitals for support, which may include:  Group support.  Education.  Hypnosis.  Therapy.   This information is not intended to replace advice given to you by your health care provider. Make sure you discuss any questions you have with your health care provider.   Document Released: 11/22/2003 Document Revised: 03/16/2014 Document Reviewed: 08/11/2012 Elsevier Interactive Patient Education [employer].

## 2015-10-29 ENCOUNTER — Encounter: Payer: Self-pay | Admitting: Physician Assistant

## 2015-12-26 ENCOUNTER — Encounter (HOSPITAL_COMMUNITY): Payer: Self-pay | Admitting: Emergency Medicine

## 2015-12-26 ENCOUNTER — Emergency Department (HOSPITAL_COMMUNITY)
Admission: EM | Admit: 2015-12-26 | Discharge: 2015-12-26 | Disposition: A | Discharge status: Home / Self Care | Payer: Medicare HMO | Attending: Dermatology | Admitting: Dermatology

## 2015-12-26 DIAGNOSIS — R51 Headache: Secondary | ICD-10-CM | POA: Insufficient documentation

## 2015-12-26 DIAGNOSIS — F17210 Nicotine dependence, cigarettes, uncomplicated: Secondary | ICD-10-CM | POA: Diagnosis not present

## 2015-12-26 DIAGNOSIS — R220 Localized swelling, mass and lump, head: Secondary | ICD-10-CM | POA: Diagnosis not present

## 2015-12-26 DIAGNOSIS — Z5321 Procedure and treatment not carried out due to patient leaving prior to being seen by health care provider: Secondary | ICD-10-CM | POA: Diagnosis not present

## 2015-12-26 NOTE — ED Triage Notes (Signed)
Pt presents to ED for assessment of HA and right sided oral swelling starting approx 1 week ago.  Denies any recent dental injury or pain.  Denies difficulty swallowing or breathing.

## 2016-02-13 ENCOUNTER — Ambulatory Visit (INDEPENDENT_AMBULATORY_CARE_PROVIDER_SITE_OTHER): Payer: Medicare HMO | Admitting: Physician Assistant

## 2016-02-13 VITALS — BP 142/102 | HR 94 | Temp 98.4°F | Resp 18 | Ht 62.0 in | Wt 228.0 lb

## 2016-02-13 DIAGNOSIS — K068 Other specified disorders of gingiva and edentulous alveolar ridge: Secondary | ICD-10-CM | POA: Diagnosis not present

## 2016-02-13 MED ORDER — IBUPROFEN 600 MG PO TABS: 600.0000 mg | ORAL_TABLET | Freq: Three times a day (TID) | ORAL | 0 refills | Status: DC | PRN

## 2016-02-13 MED ORDER — AMOXICILLIN-POT CLAVULANATE 875-125 MG PO TABS: 1.0000 | ORAL_TABLET | Freq: Two times a day (BID) | ORAL | 0 refills | Status: AC

## 2016-02-13 NOTE — Patient Instructions (Addendum)
  I would like you to return in 2 days for recheck.  Please schedule an appointment for Saturday. You can continue to use ibuprofen or tylenol for your pain.  You can do ibuprofen 800mg  every 8 hours.    Dental Pain Introduction Dental pain may be caused by many things, including:  Tooth decay (cavities or caries). Cavities cause the nerve of your tooth to be open to air and hot or cold temperatures. This can cause pain or discomfort.  Abscess or infection. A dental abscess is an area that is full of infected pus from a bacterial infection in the inner part of the tooth (pulp). It usually happens at the end of the tooth's root.  Injury.  An unknown reason (idiopathic). Your pain may be mild or severe. It may only happen when:  You are chewing.  You are exposed to hot or cold temperature.  You are eating or drinking sugary foods or beverages, such as:  Soda.  Candy. Your pain may also be there all of the time. Follow these instructions at home: Watch your dental pain for any changes. Do these things to lessen your discomfort:  Take medicines only as told by your dentist.  If your dentist tells you to take an antibiotic medicine, finish all of it even if you start to feel better.  Keep all follow-up visits as told by your dentist. This is important.  Do not apply heat to the outside of your face.  Rinse your mouth or gargle with salt water if told by your dentist. This helps with pain and swelling.  You can make salt water by adding  tsp of salt to 1 cup of warm water.  Apply ice to the painful area of your face:  Put ice in a plastic bag.  Place a towel between your skin and the bag.  Leave the ice on for 20 minutes, 2-3 times per day.  Avoid foods or drinks that cause you pain, such as:  Very hot or very cold foods or drinks.  Sweet or sugary foods or drinks. Contact a doctor if:  Your pain is not helped with medicines.  Your symptoms are worse.  You have  new symptoms. Get help right away if:  You cannot open your mouth.  You are having trouble breathing or swallowing.  You have a fever.  Your face, neck, or jaw is puffy (swollen). This information is not intended to replace advice given to you by your health care provider. Make sure you discuss any questions you have with your health care provider. Document Released: 08/12/2007 Document Revised: 08/01/2015 Document Reviewed: 02/19/2014  2017 Elsevier    IF you received an x-ray today, you will receive an invoice from [facility]. Please contact [facility] at [phone] with questions or concerns regarding your invoice.   IF you received labwork today, you will receive an invoice from [facility]. Please contact [facility] at [phone] with questions or concerns regarding your invoice.   Our billing staff will not be able to assist you with questions regarding bills from these companies.  You will be contacted with the lab results as soon as they are available. The fastest way to get your results is to activate your My Chart account. Instructions are located on the last page of this paperwork. If you have not heard from us regarding the results in 2 weeks, please contact this office.

## 2016-02-13 NOTE — Progress Notes (Signed)
[facility] [address], [place] [id number] [phone]  Date:  02/13/2016   Name:  Nancy Mcgrath   DOB:  [birth year]   MRN:  [id number]  PCP:  Default, Provider, MD    History of Present Illness:  Nancy Mcgrath is a 35 y.o. female patient who [facility]. Patient reports upper right sided mouth pain that has been for months but progressively worsened over the last week.  She states that the pain radiates up to her check and eye.  No pain with eye movement.  No visual swelling.  She has tried ibuprofen at 400mg  daily.  She denies fever.  No drainage.  She states her insurance starts in 1 month.      Patient Active Problem List   Diagnosis Date Noted  . Depression 09/02/2015  . Anxiety 09/02/2015    Past Medical History:  Diagnosis Date  . Anxiety   . Depression     History reviewed. No pertinent surgical history.  Social History  Substance Use Topics  . Smoking status: Current Every Day Smoker    Packs/day: 1.00    Years: 15.00    Types: Cigarettes  . Smokeless tobacco: Never Used  . Alcohol use No    History reviewed. No pertinent family history.  No Known Allergies  Medication list has been reviewed and updated.  Current Outpatient Prescriptions on File Prior to Visit  Medication Sig Dispense Refill  . amLODipine (NORVASC) 10 MG tablet Take 1 tablet (10 mg total) by mouth daily. 90 tablet 3  . escitalopram (LEXAPRO) 20 MG tablet Take 20 mg by mouth daily.    Marland Kitchen. ibuprofen (ADVIL,MOTRIN) 600 MG tablet Take 1 tablet (600 mg total) by mouth every 8 (eight) hours as needed. 30 tablet 0  . naproxen (NAPROSYN) 500 MG tablet Take 1 tablet (500 mg total) by mouth 2 (two) times daily with a meal. (Patient not taking: Reported on 02/13/2016) 60 tablet 0  . traMADol (ULTRAM) 50 MG tablet Take 1 tablet (50 mg total) by mouth every 8 (eight) hours as needed. (Patient not taking: Reported on 02/13/2016) 30 tablet 0   No current  facility-administered medications on file prior to visit.     ROS ROS otherwise unremarkable unless listed above.   Physical Examination: BP (!) 142/102 (BP Location: Right Arm, Patient Position: Sitting, Cuff Size: Large)   Pulse 94   Temp 98.4 F (36.9 C) (Oral)   Resp 18   Ht 5\' 2"  (1.575 m)   Wt 228 lb (103.4 kg)   LMP 01/22/2016   SpO2 100%   BMI 41.70 kg/m  Ideal Body Weight: Weight in (lb) to have BMI = 25: 136.4  Physical Exam  Constitutional: She is oriented to person, place, and time. She appears well-developed and well-nourished. No distress.  HENT:  Head: Normocephalic and atraumatic.  Right Ear: External ear normal.  Left Ear: External ear normal.  Upper gumline at the right side has a very small granulamatous lesion like lesion that is tender.  No drainage or erythema.  No swelling or fluctuance.    Eyes: Conjunctivae and EOM are normal. Pupils are equal, round, and reactive to light.  Cardiovascular: Normal rate.   Pulmonary/Chest: Effort normal. No respiratory distress.  Lymphadenopathy:       Head (right side): No preauricular and no posterior auricular adenopathy present.       Head (left side): No preauricular and no posterior auricular  adenopathy present.  Neurological: She is alert and oriented to person, place, and time.  Skin: She is not diaphoretic.  Psychiatric: She has a normal mood and affect. Her behavior is normal.     Assessment and Plan: Nancy Mcgrath is a 35 y.o. female who is here today for cc of tooth pain. No orbital swelling.  Imaging of sinus may be warranted.  Given abx today.  Advised that she should see a dentist immediately.  Advised risk/complications of tooth disease (cardiovascular risk, bone disease).  She voiced understanding.  Given nsaid.  Pain in gums - Plan: amoxicillin-clavulanate (AUGMENTIN) 875-125 MG tablet, ibuprofen (ADVIL,MOTRIN) 600 MG tablet  Trena PlattStephanie Samba Cumba, PA-C [facility] 02/13/2016 2:45 PM

## 2016-02-15 ENCOUNTER — Ambulatory Visit (INDEPENDENT_AMBULATORY_CARE_PROVIDER_SITE_OTHER): Payer: Medicare HMO | Admitting: Family Medicine

## 2016-02-15 VITALS — BP 128/86 | HR 105 | Temp 98.8°F | Resp 16 | Ht 62.0 in | Wt 237.0 lb

## 2016-02-15 DIAGNOSIS — K068 Other specified disorders of gingiva and edentulous alveolar ridge: Secondary | ICD-10-CM | POA: Diagnosis not present

## 2016-02-15 MED ORDER — TRAMADOL HCL 50 MG PO TABS: 50.0000 mg | ORAL_TABLET | Freq: Three times a day (TID) | ORAL | 0 refills | Status: DC | PRN

## 2016-02-15 NOTE — Patient Instructions (Addendum)
Continue antibiotics as previously prescribed.  You may take Tramadol 50 mg every 8 hours as needed for pain.  Please follow-up with a dentist at your earliest convenience.  IF you received an x-ray today, you will receive an invoice from [facility]. Please contact [facility] at [phone] with questions or concerns regarding your invoice.   IF you received labwork today, you will receive an invoice from [facility]. Please contact [facility] at [phone] with questions or concerns regarding your invoice.   Our billing staff will not be able to assist you with questions regarding bills from these companies.  You will be contacted with the lab results as soon as they are available. The fastest way to get your results is to activate your My Chart account. Instructions are located on the last page of this paperwork. If you have not heard from us regarding the results in 2 weeks, please contact this office.     Dental Pain Introduction Dental pain may be caused by many things, including:  Tooth decay (cavities or caries). Cavities cause the nerve of your tooth to be open to air and hot or cold temperatures. This can cause pain or discomfort.  Abscess or infection. A dental abscess is an area that is full of infected pus from a bacterial infection in the inner part of the tooth (pulp). It usually happens at the end of the tooth's root.  Injury.  An unknown reason (idiopathic). Your pain may be mild or severe. It may only happen when:  You are chewing.  You are exposed to hot or cold temperature.  You are eating or drinking sugary foods or beverages, such as:  Soda.  Candy. Your pain may also be there all of the time. Follow these instructions at home: Watch your dental pain for any changes. Do these things to lessen your discomfort:  Take medicines only as told by your dentist.  If your dentist tells you to take an antibiotic  medicine, finish all of it even if you start to feel better.  Keep all follow-up visits as told by your dentist. This is important.  Do not apply heat to the outside of your face.  Rinse your mouth or gargle with salt water if told by your dentist. This helps with pain and swelling.  You can make salt water by adding  tsp of salt to 1 cup of warm water.  Apply ice to the painful area of your face:  Put ice in a plastic bag.  Place a towel between your skin and the bag.  Leave the ice on for 20 minutes, 2-3 times per day.  Avoid foods or drinks that cause you pain, such as:  Very hot or very cold foods or drinks.  Sweet or sugary foods or drinks. Contact a doctor if:  Your pain is not helped with medicines.  Your symptoms are worse.  You have new symptoms. Get help right away if:  You cannot open your mouth.  You are having trouble breathing or swallowing.  You have a fever.  Your face, neck, or jaw is puffy (swollen). This information is not intended to replace advice given to you by your health care provider. Make sure you discuss any questions you have with your health care provider. Document Released: 08/12/2007 Document Revised: 08/01/2015 Document Reviewed: 02/19/2014  2017 Elsevier

## 2016-02-15 NOTE — Progress Notes (Signed)
   Patient ID: Nancy Mcgrath, female    DOB: 04-01-1980, 35 y.o.   MRN: [id number]  PCP: Default, Provider, MD  Chief Complaint  Patient presents with  . Follow-up    Tooth pain - pt was given antibx on 12/7    Subjective:   HPI 35 year old female presents for tooth pain follow-up. She was initially seen and placed on Augmentin on 02/13/16. She was asked to follow-up due to experiencing upper right sided facial pain due to gum abscess. Reports continued pain with minimal relief taking 800 mg ibuprofen. She is aware that she needs a dental evaluation, however will not have dental coverage until after January. She continues to take prescribed antibiotics.  Social History   Social History  . Marital status: Single    Spouse name: N/A  . Number of children: N/A  . Years of education: N/A   Occupational History  . Not on file.   Social History Main Topics  . Smoking status: Current Every Day Smoker    Packs/day: 1.00    Years: 15.00    Types: Cigarettes  . Smokeless tobacco: Never Used  . Alcohol use No  . Drug use: No  . Sexual activity: No   Other Topics Concern  . Not on file   Social History Narrative  . No narrative on file   No family history on file.  Review of Systems See HPI  Patient Active Problem List   Diagnosis Date Noted  . Depression 09/02/2015  . Anxiety 09/02/2015   Prior to Admission medications   Medication Sig Start Date End Date Taking? Authorizing Provider  ALPRAZolam Prudy Feeler(XANAX) 0.5 MG tablet Take 0.5 mg by mouth at bedtime as needed for anxiety.   Yes Historical Provider, MD  amLODipine (NORVASC) 10 MG tablet Take 1 tablet (10 mg total) by mouth daily. 10/24/15  Yes Elizabeth Whitney McVey, PA-C  amoxicillin-clavulanate (AUGMENTIN) 875-125 MG tablet Take 1 tablet by mouth 2 (two) times daily. 02/13/16 02/23/16 Yes Stephanie D English, PA  escitalopram (LEXAPRO) 20 MG tablet Take 20 mg by mouth daily.   Yes Historical Provider, MD  ibuprofen  (ADVIL,MOTRIN) 600 MG tablet Take 1 tablet (600 mg total) by mouth every 8 (eight) hours as needed. 02/13/16  Yes Stephanie D English, PA   No Known Allergies     Objective:  Physical Exam  Constitutional: She is oriented to person, place, and time. She appears well-developed and well-nourished.  HENT:  Head: Head is without raccoon's eyes and without right periorbital erythema.  Mouth/Throat: No tonsillar abscesses.    Neck: Normal range of motion. Neck supple.  Pulmonary/Chest: Effort normal.  Neurological: She is alert and oriented to person, place, and time.  Skin: Skin is warm and dry.  Psychiatric: She has a normal mood and affect. Her behavior is normal. Judgment and thought content normal.    Vitals:   02/15/16 1433  BP: 128/86  Pulse: (!) 105  Resp: 16  Temp: 98.8 F (37.1 C)     Assessment & Plan:  1. Pain in gums Plan: Continue Augmentin as prescribed For pain, take Tramadol 50 mg every 8 hours as needed for gum pain. Follow-up with a dentist for further evaluation.  Godfrey PickKimberly S. Tiburcio PeaHarris, MSN, FNP-C [facility]

## 2016-07-08 ENCOUNTER — Emergency Department
Admission: EM | Admit: 2016-07-08 | Discharge: 2016-07-08 | Disposition: A | Discharge status: Home / Self Care | Payer: Medicare Other | Attending: Emergency Medicine | Admitting: Emergency Medicine

## 2016-07-08 DIAGNOSIS — I10 Essential (primary) hypertension: Secondary | ICD-10-CM

## 2016-07-08 DIAGNOSIS — R42 Dizziness and giddiness: Secondary | ICD-10-CM | POA: Diagnosis present

## 2016-07-08 DIAGNOSIS — Z79899 Other long term (current) drug therapy: Secondary | ICD-10-CM | POA: Insufficient documentation

## 2016-07-08 DIAGNOSIS — F17210 Nicotine dependence, cigarettes, uncomplicated: Secondary | ICD-10-CM | POA: Insufficient documentation

## 2016-07-08 LAB — COMPREHENSIVE METABOLIC PANEL
ALT: 9 U/L — AB (ref 14–54)
AST: 20 U/L (ref 15–41)
Albumin: 4.2 g/dL (ref 3.5–5.0)
Alkaline Phosphatase: 61 U/L (ref 38–126)
Anion gap: 7 (ref 5–15)
BUN: 10 mg/dL (ref 6–20)
CHLORIDE: 107 mmol/L (ref 101–111)
CO2: 27 mmol/L (ref 22–32)
Calcium: 9.1 mg/dL (ref 8.9–10.3)
Creatinine, Ser: 1.03 mg/dL — ABNORMAL HIGH (ref 0.44–1.00)
GFR calc non Af Amer: >60 mL/min (ref >60)
Glucose, Bld: 98 mg/dL (ref 65–99)
POTASSIUM: 3.6 mmol/L (ref 3.5–5.1)
SODIUM: 141 mmol/L (ref 135–145)
Total Bilirubin: 0.4 mg/dL (ref 0.3–1.2)
Total Protein: 7.9 g/dL (ref 6.5–8.1)

## 2016-07-08 LAB — CBC
HCT: 41.1 % (ref 35.0–47.0)
Hemoglobin: 13.6 g/dL (ref 12.0–16.0)
MCH: 28.1 pg (ref 26.0–34.0)
MCHC: 33 g/dL (ref 32.0–36.0)
MCV: 85.2 fL (ref 80.0–100.0)
PLATELETS: 305 10*3/uL (ref 150–440)
RBC: 4.82 MIL/uL (ref 3.80–5.20)
RDW: 15 % — AB (ref 11.5–14.5)
WBC: 10.6 10*3/uL (ref 3.6–11.0)

## 2016-07-08 LAB — TROPONIN I: Troponin I: <0.03 ng/mL (ref <0.03)

## 2016-07-08 LAB — POCT PREGNANCY, URINE: Preg Test, Ur: NEGATIVE

## 2016-07-08 MED ORDER — ONDANSETRON 4 MG PO TBDP
ORAL_TABLET | ORAL | Status: AC
  Filled 2016-07-08: qty 1

## 2016-07-08 MED ORDER — AMLODIPINE BESYLATE 10 MG PO TABS: 10.0000 mg | ORAL_TABLET | Freq: Every day | ORAL | 1 refills | Status: DC

## 2016-07-08 MED ORDER — ONDANSETRON 4 MG PO TBDP
4.0000 mg | ORAL_TABLET | Freq: Once | ORAL | Status: AC
  Administered 2016-07-08: 4 mg via ORAL

## 2016-07-08 NOTE — ED Provider Notes (Signed)
[facility] Emergency Department Provider Note   ____________________________________________   I have reviewed the triage vital signs and the nursing notes.   HISTORY  Chief Complaint Dizziness   History limited by: Not Limited   HPI Nancy Mcgrath is a 36 y.o. female who presents to the emergency department today because of concerns for high blood pressure and feelings of dizziness. The patient states that she has a history of high blood pressure and was on medication at one time. The patient however states she stopped the medication couple of months ago on her own. Patient states that recently she has felt dizzy and lightheaded. She has also had some nausea. Checked her blood pressure and found it was 160 over 120s. Patient denies any chest pain, palpitations or shortness breath.  Past Medical History:  Diagnosis Date  . Anxiety   . Depression     Patient Active Problem List   Diagnosis Date Noted  . Depression 09/02/2015  . Anxiety 09/02/2015    No past surgical history on file.  Prior to Admission medications   Medication Sig Start Date End Date Taking? Authorizing Provider  ALPRAZolam Prudy Feeler) 0.5 MG tablet Take 0.5 mg by mouth at bedtime as needed for anxiety.    Historical Provider, MD  amLODipine (NORVASC) 10 MG tablet Take 1 tablet (10 mg total) by mouth daily. 10/24/15   Elizabeth Whitney McVey, PA-C  escitalopram (LEXAPRO) 20 MG tablet Take 20 mg by mouth daily.    Historical Provider, MD  ibuprofen (ADVIL,MOTRIN) 600 MG tablet Take 1 tablet (600 mg total) by mouth every 8 (eight) hours as needed. 02/13/16   Collie Siad English, PA  traMADol (ULTRAM) 50 MG tablet Take 1 tablet (50 mg total) by mouth every 8 (eight) hours as needed. 02/15/16   Bing Neighbors, FNP    Allergies Patient has no known allergies.  No family history on file.  Social History Social History  Substance Use Topics  . Smoking status: Current Every Day Smoker   Packs/day: 1.00    Years: 15.00    Types: Cigarettes  . Smokeless tobacco: Never Used  . Alcohol use No    Review of Systems Constitutional: No fever/chills Eyes: No visual changes. ENT: No sore throat. Cardiovascular: Denies chest pain. Respiratory: Denies shortness of breath. Gastrointestinal: No abdominal pain.  Positive for nausea, no vomiting.  No diarrhea.   Genitourinary: Negative for dysuria. Musculoskeletal: Negative for back pain. Skin: Negative for rash. Neurological: Positive for dizziness.   ____________________________________________   PHYSICAL EXAM:  VITAL SIGNS: ED Triage Vitals  Enc Vitals Group     BP 07/08/16 2009 (!) 160/124     Pulse Rate 07/08/16 2009 79     Resp 07/08/16 2009 18     Temp 07/08/16 2009 99 F (37.2 C)     Temp Source 07/08/16 2009 Oral     SpO2 07/08/16 2009 100 %     Weight 07/08/16 2010 230 lb (104.3 kg)     Height 07/08/16 2010  (1.575 m)     Head Circumference --     Constitutional: Alert and oriented. Well appearing and in no distress. Eyes: Conjunctivae are normal. Normal extraocular movements. ENT   Head: Normocephalic and atraumatic.   Nose: No congestion/rhinnorhea.   Mouth/Throat: Mucous membranes are moist.   Neck: No stridor. Hematological/Lymphatic/Immunilogical: No cervical lymphadenopathy. Cardiovascular: Normal rate, regular rhythm.  No murmurs, rubs, or gallops.  Respiratory: Normal respiratory effort without tachypnea nor retractions. Breath sounds  are clear and equal bilaterally. No wheezes/rales/rhonchi. Gastrointestinal: Soft and non tender. No rebound. No guarding.  Genitourinary: Deferred Musculoskeletal: Normal range of motion in all extremities. No lower extremity edema. Neurologic:  Normal speech and language. No gross focal neurologic deficits are appreciated.  Skin:  Skin is warm, dry and intact. No rash noted. Psychiatric: Mood and affect are normal. Speech and behavior are  normal. Patient exhibits appropriate insight and judgment.  ____________________________________________    LABS (pertinent positives/negatives)  Labs Reviewed  CBC - Abnormal; Notable for the following:       Result Value   RDW 15.0 (*)    All other components within normal limits  COMPREHENSIVE METABOLIC PANEL - Abnormal; Notable for the following:    Creatinine, Ser 1.03 (*)    ALT 9 (*)    All other components within normal limits  TROPONIN I  POC URINE PREG, ED  POCT PREGNANCY, URINE  '   ____________________________________________   EKG  Lurline Idol, attending physician, personally viewed and interpreted this EKG  EKG Time: 2029 Rate: 76 Rhythm: normal sinus rhythm Axis: normal Intervals: qtc 452 QRS: narrow, q waves V1 ST changes: no st elevation Impression: abnormal ekg   ____________________________________________    RADIOLOGY  None  ____________________________________________   PROCEDURES  Procedures  ____________________________________________   INITIAL IMPRESSION / ASSESSMENT AND PLAN / ED COURSE  Pertinent labs & imaging results that were available during my care of the patient were reviewed by me and considered in my medical decision making (see chart for details).  Patient here for high blood pressure and dizziness. Blood work without any concerning findings. Patient's blood pressure is elevated. Will restart patient on her previous blood pressure medication. Will outpatient follow-up with primary care.  ____________________________________________   FINAL CLINICAL IMPRESSION(S) / ED DIAGNOSES  Final diagnoses:  Hypertension, unspecified type  Dizziness     Note: This dictation was prepared with Dragon dictation. Any transcriptional errors that result from this process are unintentional     Phineas Semen, MD 07/08/16 2223

## 2016-07-08 NOTE — Discharge Instructions (Signed)
Please seek medical attention for any high fevers, chest pain, shortness of breath, change in behavior, persistent vomiting, bloody stool or any other new or concerning symptoms.  

## 2016-07-08 NOTE — ED Triage Notes (Signed)
Pt co dizziness since yesterday states feels shaky at times, denies any chest pain or shob. Checked bp at home and was high , ran out of bp med since February.

## 2016-07-14 ENCOUNTER — Ambulatory Visit (INDEPENDENT_AMBULATORY_CARE_PROVIDER_SITE_OTHER): Payer: Medicare Other | Admitting: Internal Medicine

## 2016-07-14 ENCOUNTER — Encounter: Payer: Self-pay | Admitting: Internal Medicine

## 2016-07-14 VITALS — BP 132/82 | HR 88 | Temp 98.1°F | Ht 62.0 in | Wt 219.0 lb

## 2016-07-14 DIAGNOSIS — F32A Depression, unspecified: Secondary | ICD-10-CM

## 2016-07-14 DIAGNOSIS — F329 Major depressive disorder, single episode, unspecified: Secondary | ICD-10-CM

## 2016-07-14 DIAGNOSIS — I10 Essential (primary) hypertension: Secondary | ICD-10-CM | POA: Diagnosis not present

## 2016-07-14 DIAGNOSIS — F419 Anxiety disorder, unspecified: Secondary | ICD-10-CM

## 2016-07-14 MED ORDER — ESCITALOPRAM OXALATE 20 MG PO TABS: 20.0000 mg | ORAL_TABLET | Freq: Every day | ORAL | 11 refills | Status: DC

## 2016-07-14 NOTE — Assessment & Plan Note (Signed)
Deteriorated off meds Will restart Lexapro, eRx sent to pharmacy Advised her that if she wants to restart Xanax, we will need to refer to psych, she declines at this time

## 2016-07-14 NOTE — Assessment & Plan Note (Signed)
Improved but still not at goal Continue Norvasc  Will reevaluate in 3 weeks for follow up HTN

## 2016-07-14 NOTE — Progress Notes (Signed)
HPI  Pt presents to the clinic today to establish care and for management of the conditions listed below. She has not had a PCP in many years.  Anxiety and Depression: She is not sure what triggers this. She reports she stopped taking the Lexapro 3 weeks and the Xanax 2 weeks ago. She has been feeling dizzy,  jittery and nauseated over the last week. She denies vomiting. She follows with [facility].  HTN: She had a recent ER visit for this. BP was 1168/120. She had taken herself off her Amlodipine months prior, because she felt she may not need it anymore. She has been taking it daily for the last 5 days. Her BP today is 132/82.  Flu: never Tetanus: 09/2014 Pap Smear: She thinks she had one in 2013 Dentist: as needed  Past Medical History:  Diagnosis Date  . Anxiety   . Depression   . Hypertension     Current Outpatient Prescriptions  Medication Sig Dispense Refill  . amLODipine (NORVASC) 10 MG tablet Take 1 tablet (10 mg total) by mouth daily. 30 tablet 1  . Multiple Vitamin (MULTIVITAMIN) tablet Take 1 tablet by mouth daily.    Marland Kitchen ALPRAZolam (XANAX) 0.5 MG tablet Take 0.5 mg by mouth at bedtime as needed for anxiety.    Marland Kitchen escitalopram (LEXAPRO) 20 MG tablet Take 20 mg by mouth daily.     No current facility-administered medications for this visit.     No Known Allergies  Family History  Problem Relation Age of Onset  . Diabetes Paternal Grandmother     Social History   Social History  . Marital status: Single    Spouse name: N/A  . Number of children: N/A  . Years of education: N/A   Occupational History  . Not on file.   Social History Main Topics  . Smoking status: Current Every Day Smoker    Packs/day: 1.00    Years: 15.00    Types: Cigarettes  . Smokeless tobacco: Never Used  . Alcohol use No  . Drug use: No  . Sexual activity: No   Other Topics Concern  . Not on file   Social History Narrative  . No narrative on file     ROS:  Constitutional: Denies fever, malaise, fatigue, headache or abrupt weight changes.  HEENT: Denies eye pain, eye redness, ear pain, ringing in the ears, wax buildup, runny nose, nasal congestion, bloody nose, or sore throat. Respiratory: Denies difficulty breathing, shortness of breath, cough or sputum production.   Cardiovascular: Denies chest pain, chest tightness, palpitations or swelling in the hands or feet.  Gastrointestinal: Pt reports nausea. Denies abdominal pain, bloating, constipation, diarrhea or blood in the stool.  GU: Denies frequency, urgency, pain with urination, blood in urine, odor or discharge. Musculoskeletal: Denies decrease in range of motion, difficulty with gait, muscle pain or joint pain and swelling.  Skin: Denies redness, rashes, lesions or ulcercations.  Neurological: Pt reports dizziness and jitteriness. Denies difficulty with memory, difficulty with speech or problems with balance and coordination.  Psych: Pt reports anxiety and has history of depression. Denies SI/HI.  No other specific complaints in a complete review of systems (except as listed in HPI above).  PE:  BP 132/82   Pulse 88   Temp 98.1 F (36.7 C) (Oral)   Ht 5\' 2"  (1.575 m)   Wt 219 lb (99.3 kg)   LMP 07/08/2016   SpO2 100%   BMI 40.06 kg/m   Wt Readings  from Last 3 Encounters:  07/14/16 219 lb (99.3 kg)  07/08/16 230 lb (104.3 kg)  02/15/16 237 lb (107.5 kg)    General: Appears her stated age, obese in NAD. Skin: Clammy. Cardiovascular: Normal rate and rhythm. S1,S2 noted.  No murmur, rubs or gallops noted.  Pulmonary/Chest: Normal effort and positive vesicular breath sounds. No respiratory distress. No wheezes, rales or ronchi noted.  Abdomen: Soft and nontender. Normal bowel sounds. No distention or masses noted.  Neurological: Alert and oriented.  Psychiatric: She is extremely anxious today.  BMET    Component Value Date/Time   NA 141 07/08/2016 2030   K 3.6  07/08/2016 2030   CL 107 07/08/2016 2030   CO2 27 07/08/2016 2030   GLUCOSE 98 07/08/2016 2030   BUN 10 07/08/2016 2030   CREATININE 1.03 (H) 07/08/2016 2030   CREATININE 0.99 10/24/2015 1154   CALCIUM 9.1 07/08/2016 2030   GFRNONAA >60 07/08/2016 2030   GFRNONAA 76 10/24/2015 1154   GFRAA >60 07/08/2016 2030   GFRAA 87 10/24/2015 1154    Lipid Panel     Component Value Date/Time   CHOL 200 10/24/2015 1154   TRIG 89 10/24/2015 1154   HDL 44 (L) 10/24/2015 1154   CHOLHDL 4.5 10/24/2015 1154   VLDL 18 10/24/2015 1154   LDLCALC 138 (H) 10/24/2015 1154    CBC    Component Value Date/Time   WBC 10.6 07/08/2016 2030   RBC 4.82 07/08/2016 2030   HGB 13.6 07/08/2016 2030   HCT 41.1 07/08/2016 2030   PLT 305 07/08/2016 2030   MCV 85.2 07/08/2016 2030   MCH 28.1 07/08/2016 2030   MCHC 33.0 07/08/2016 2030   RDW 15.0 (H) 07/08/2016 2030   LYMPHSABS 4.0 03/02/2013 1328   MONOABS 0.6 03/02/2013 1328   EOSABS 0.1 03/02/2013 1328   BASOSABS 0.0 03/02/2013 1328    Hgb A1C No results found for: HGBA1C   Assessment and Plan:

## 2016-07-14 NOTE — Patient Instructions (Signed)
Hypertension °Hypertension is another name for high blood pressure. High blood pressure forces your heart to work harder to pump blood. This can cause problems over time. °There are two numbers in a blood pressure reading. There is a top number (systolic) over a bottom number (diastolic). It is best to have a blood pressure below 120/80. Healthy choices can help lower your blood pressure. You may need medicine to help lower your blood pressure if: °· Your blood pressure cannot be lowered with healthy choices. °· Your blood pressure is higher than 130/80. °Follow these instructions at home: °Eating and drinking  °· If directed, follow the DASH eating plan. This diet includes: °¨ Filling half of your plate at each meal with fruits and vegetables. °¨ Filling one quarter of your plate at each meal with whole grains. Whole grains include whole wheat pasta, brown rice, and whole grain bread. °¨ Eating or drinking low-fat dairy products, such as skim milk or low-fat yogurt. °¨ Filling one quarter of your plate at each meal with low-fat (lean) proteins. Low-fat proteins include fish, skinless chicken, eggs, beans, and tofu. °¨ Avoiding fatty meat, cured and processed meat, or chicken with skin. °¨ Avoiding premade or processed food. °· Eat less than 1,500 mg of salt (sodium) a day. °· Limit alcohol use to no more than 1 drink a day for nonpregnant women and 2 drinks a day for men. One drink equals 12 oz of beer, 5 oz of wine, or 1½ oz of hard liquor. °Lifestyle  °· Work with your doctor to stay at a healthy weight or to lose weight. Ask your doctor what the best weight is for you. °· Get at least 30 minutes of exercise that causes your heart to beat faster (aerobic exercise) most days of the week. This may include walking, swimming, or biking. °· Get at least 30 minutes of exercise that strengthens your muscles (resistance exercise) at least 3 days a week. This may include lifting weights or pilates. °· Do not use any  products that contain nicotine or tobacco. This includes cigarettes and e-cigarettes. If you need help quitting, ask your doctor. °· Check your blood pressure at home as told by your doctor. °· Keep all follow-up visits as told by your doctor. This is important. °Medicines  °· Take over-the-counter and prescription medicines only as told by your doctor. Follow directions carefully. °· Do not skip doses of blood pressure medicine. The medicine does not work as well if you skip doses. Skipping doses also puts you at risk for problems. °· Ask your doctor about side effects or reactions to medicines that you should watch for. °Contact a doctor if: °· You think you are having a reaction to the medicine you are taking. °· You have headaches that keep coming back (recurring). °· You feel dizzy. °· You have swelling in your ankles. °· You have trouble with your vision. °Get help right away if: °· You get a very bad headache. °· You start to feel confused. °· You feel weak or numb. °· You feel faint. °· You get very bad pain in your: °¨ Chest. °¨ Belly (abdomen). °· You throw up (vomit) more than once. °· You have trouble breathing. °Summary °· Hypertension is another name for high blood pressure. °· Making healthy choices can help lower blood pressure. If your blood pressure cannot be controlled with healthy choices, you may need to take medicine. °This information is not intended to replace advice given to you by your   health care provider. Make sure you discuss any questions you have with your health care provider. °Document Released: 08/12/2007 Document Revised: 01/22/2016 Document Reviewed: 01/22/2016 °Elsevier Interactive Patient Education © 2017 [employer] ° °

## 2016-07-16 ENCOUNTER — Other Ambulatory Visit: Payer: Self-pay | Admitting: *Deleted

## 2016-07-16 ENCOUNTER — Telehealth: Payer: Self-pay | Admitting: *Deleted

## 2016-07-16 NOTE — Telephone Encounter (Signed)
Received by fax:  Script clarification:  Rx recently sent for Escitaloprim 20 mg #30 with 11 RF Psych MD was prescribing 40 mg.  Please advise.

## 2016-07-16 NOTE — Telephone Encounter (Signed)
If previous MD was prescribing 40 mg daily, send in 40 mg daily.

## 2016-07-16 NOTE — Telephone Encounter (Signed)
Called pt unable to lmovm  

## 2016-07-17 NOTE — Telephone Encounter (Signed)
Spoke to pt and she reports psych filled meds and it is 40mg ... Med list updated

## 2016-07-17 NOTE — Addendum Note (Signed)
Addended by: Roena MaladyEVONTENNO, Terena Bohan Y on: 07/17/2016 05:26 PM   Modules accepted: Orders

## 2016-07-21 ENCOUNTER — Ambulatory Visit: Payer: Medicare Other | Admitting: Family

## 2016-08-06 ENCOUNTER — Other Ambulatory Visit (HOSPITAL_COMMUNITY)
Admission: RE | Admit: 2016-08-06 | Discharge: 2016-08-06 | Disposition: A | Discharge status: Home / Self Care | Payer: Medicare Other | Source: Ambulatory Visit | Attending: Internal Medicine | Admitting: Internal Medicine

## 2016-08-06 ENCOUNTER — Encounter: Payer: Self-pay | Admitting: Internal Medicine

## 2016-08-06 ENCOUNTER — Ambulatory Visit (INDEPENDENT_AMBULATORY_CARE_PROVIDER_SITE_OTHER): Payer: Medicare Other | Admitting: Internal Medicine

## 2016-08-06 VITALS — BP 132/82 | HR 89 | Temp 98.2°F | Ht 62.0 in | Wt 220.5 lb

## 2016-08-06 DIAGNOSIS — I10 Essential (primary) hypertension: Secondary | ICD-10-CM

## 2016-08-06 DIAGNOSIS — Z0001 Encounter for general adult medical examination with abnormal findings: Secondary | ICD-10-CM

## 2016-08-06 DIAGNOSIS — Z124 Encounter for screening for malignant neoplasm of cervix: Secondary | ICD-10-CM

## 2016-08-06 LAB — LIPID PANEL
Cholesterol: 209 mg/dL — ABNORMAL HIGH (ref 0–200)
HDL: 40.3 mg/dL (ref >39.00)
LDL Cholesterol: 151 mg/dL — ABNORMAL HIGH (ref 0–99)
NonHDL: 168.97
TRIGLYCERIDES: 92 mg/dL (ref 0.0–149.0)
Total CHOL/HDL Ratio: 5
VLDL: 18.4 mg/dL (ref 0.0–40.0)

## 2016-08-06 LAB — COMPREHENSIVE METABOLIC PANEL
ALBUMIN: 4.3 g/dL (ref 3.5–5.2)
ALK PHOS: 68 U/L (ref 39–117)
ALT: 11 U/L (ref 0–35)
AST: 17 U/L (ref 0–37)
BILIRUBIN TOTAL: 0.6 mg/dL (ref 0.2–1.2)
BUN: 11 mg/dL (ref 6–23)
CALCIUM: 9.3 mg/dL (ref 8.4–10.5)
CO2: 29 mEq/L (ref 19–32)
Chloride: 105 mEq/L (ref 96–112)
Creatinine, Ser: 0.88 mg/dL (ref 0.40–1.20)
GFR: 94.77 mL/min (ref >60.00)
GLUCOSE: 102 mg/dL — AB (ref 70–99)
POTASSIUM: 3.8 meq/L (ref 3.5–5.1)
Sodium: 138 mEq/L (ref 135–145)
TOTAL PROTEIN: 8.1 g/dL (ref 6.0–8.3)

## 2016-08-06 LAB — CBC
HEMATOCRIT: 40 % (ref 36.0–46.0)
Hemoglobin: 13.1 g/dL (ref 12.0–15.0)
MCHC: 32.9 g/dL (ref 30.0–36.0)
MCV: 84.2 fl (ref 78.0–100.0)
Platelets: 357 10*3/uL (ref 150.0–400.0)
RBC: 4.75 Mil/uL (ref 3.87–5.11)
RDW: 14.6 % (ref 11.5–15.5)
WBC: 9.8 10*3/uL (ref 4.0–10.5)

## 2016-08-06 LAB — HEMOGLOBIN A1C: Hgb A1c MFr Bld: 6.2 % (ref 4.6–6.5)

## 2016-08-06 MED ORDER — AMLODIPINE BESY-BENAZEPRIL HCL 5-10 MG PO CAPS: 1.0000 | ORAL_CAPSULE | Freq: Every day | ORAL | 0 refills | Status: DC

## 2016-08-06 NOTE — Addendum Note (Signed)
Addended by: Roena MaladyEVONTENNO, Keon Pender Y on: 08/06/2016 02:37 PM   Modules accepted: Orders

## 2016-08-06 NOTE — Assessment & Plan Note (Signed)
Still not at goal Stop Norvasc Start Amlodipine-Benazepril 5-10

## 2016-08-06 NOTE — Progress Notes (Signed)
Subjective:    Patient ID: Nancy Mcgrath, female    DOB: 1981-01-31, 36 y.o.   MRN: [id number]  HPI  Pt presents to the clinic today for her annual exam. She is also due for 3 week follow up of HTN. At her last visit, her Norvasc was restarted. She has been taking it as prescribed. She denies adverse side effects. Her BP today is 132/82.  Flu: never Tetanus: 09/2014 Pap Smear: about 5 years ago Dentist: as needed  Diet: She does eat lean meat. She tries to eat fruits and veggies but she does not like veggies that much. She occasionally eats fried foods. She drinks mostly soda, no water. Exercise: None  Review of Systems      Past Medical History:  Diagnosis Date  . Anxiety   . Depression   . Hypertension     Current Outpatient Prescriptions  Medication Sig Dispense Refill  . ALPRAZolam (XANAX) 0.5 MG tablet Take 0.5 mg by mouth at bedtime as needed for anxiety.    Marland Kitchen amLODipine (NORVASC) 10 MG tablet Take 1 tablet (10 mg total) by mouth daily. 30 tablet 1  . escitalopram (LEXAPRO) 20 MG tablet Take 2 tablets (40 mg total) by mouth daily.    . Multiple Vitamin (MULTIVITAMIN) tablet Take 1 tablet by mouth daily.    Marland Kitchen zolpidem (AMBIEN) 5 MG tablet Take 5 mg by mouth at bedtime.  2   No current facility-administered medications for this visit.     No Known Allergies  Family History  Problem Relation Age of Onset  . Diabetes Paternal Grandmother   . Cancer Neg Hx   . Heart disease Neg Hx   . Stroke Neg Hx     Social History   Social History  . Marital status: Single    Spouse name: N/A  . Number of children: N/A  . Years of education: N/A   Occupational History  . Not on file.   Social History Main Topics  . Smoking status: Current Every Day Smoker    Packs/day: 1.00    Years: 15.00    Types: Cigarettes  . Smokeless tobacco: Never Used  . Alcohol use No  . Drug use: Yes    Types: Marijuana  . Sexual activity: No   Other Topics Concern  . Not on file     Social History Narrative  . No narrative on file     Constitutional: Pt reports intermittent headaches. Denies fever, malaise, fatigue, or abrupt weight changes.  HEENT: Denies eye pain, eye redness, ear pain, ringing in the ears, wax buildup, runny nose, nasal congestion, bloody nose, or sore throat. Respiratory: Denies difficulty breathing, shortness of breath, cough or sputum production.   Cardiovascular: Denies chest pain, chest tightness, palpitations or swelling in the hands or feet.  Gastrointestinal: Denies abdominal pain, bloating, constipation, diarrhea or blood in the stool.  GU: Denies urgency, frequency, pain with urination, burning sensation, blood in urine, odor or discharge. Musculoskeletal: Denies decrease in range of motion, difficulty with gait, muscle pain or joint pain and swelling.  Skin: Denies redness, rashes, lesions or ulcercations.  Neurological: Pt reports jitteriness. Denies dizziness, difficulty with memory, difficulty with speech or problems with balance and coordination.  Psych: Pt reports anxiety and has a history of depression. Denies SI/HI.  No other specific complaints in a complete review of systems (except as listed in HPI above).  Objective:   Physical Exam  BP 132/82   Pulse 89   Temp  98.2 F (36.8 C) (Oral)   Ht 5\' 2"  (1.575 m)   Wt 220 lb 8 oz (100 kg)   LMP 07/31/2016   SpO2 97%   BMI 40.33 kg/m  Wt Readings from Last 3 Encounters:  08/06/16 220 lb 8 oz (100 kg)  07/14/16 219 lb (99.3 kg)  07/08/16 230 lb (104.3 kg)    General: Appears her stated age, obese in NAD. Skin: Warm, dry and intact. Rash to left temple which she reports is her birthmark. HEENT: Head: normal shape and size; Eyes: sclera white, no icterus, conjunctiva pink, PERRLA and EOMs intact; Ears: Tm's gray and intact, normal light reflex; Throat/Mouth: Teeth present, mucosa pink and moist, no exudate, lesions or ulcerations noted.  Neck:  Neck supple, trachea  midline. No masses, lumps or thyromegaly present.  Cardiovascular: Normal rate and rhythm. S1,S2 noted.  No murmur, rubs or gallops noted. No JVD or BLE edema.  Pulmonary/Chest: Normal effort and positive vesicular breath sounds. No respiratory distress. No wheezes, rales or ronchi noted.  Abdomen: Soft and nontender. Normal bowel sounds. No distention or masses noted. Liver, spleen and kidneys non palpable. Pelvic: Normal female anatomy. Cervix without changes. Old bloody discharge noted. No CMT. Adnexa non palpable. Musculoskeletal: Strength 5/5 BUE/BLE. No difficulty with gait.  Neurological: Alert and oriented. Cranial nerves II-XII grossly intact. Coordination normal.  Psychiatric: Mood and affect normal. Behavior is normal. Judgment and thought content normal.     BMET    Component Value Date/Time   NA 141 07/08/2016 2030   K 3.6 07/08/2016 2030   CL 107 07/08/2016 2030   CO2 27 07/08/2016 2030   GLUCOSE 98 07/08/2016 2030   BUN 10 07/08/2016 2030   CREATININE 1.03 (H) 07/08/2016 2030   CREATININE 0.99 10/24/2015 1154   CALCIUM 9.1 07/08/2016 2030   GFRNONAA >60 07/08/2016 2030   GFRNONAA 76 10/24/2015 1154   GFRAA >60 07/08/2016 2030   GFRAA 87 10/24/2015 1154    Lipid Panel     Component Value Date/Time   CHOL 200 10/24/2015 1154   TRIG 89 10/24/2015 1154   HDL 44 (L) 10/24/2015 1154   CHOLHDL 4.5 10/24/2015 1154   VLDL 18 10/24/2015 1154   LDLCALC 138 (H) 10/24/2015 1154    CBC    Component Value Date/Time   WBC 10.6 07/08/2016 2030   RBC 4.82 07/08/2016 2030   HGB 13.6 07/08/2016 2030   HCT 41.1 07/08/2016 2030   PLT 305 07/08/2016 2030   MCV 85.2 07/08/2016 2030   MCH 28.1 07/08/2016 2030   MCHC 33.0 07/08/2016 2030   RDW 15.0 (H) 07/08/2016 2030   LYMPHSABS 4.0 03/02/2013 1328   MONOABS 0.6 03/02/2013 1328   EOSABS 0.1 03/02/2013 1328   BASOSABS 0.0 03/02/2013 1328    Hgb A1C No results found for: HGBA1C          Assessment & Plan:    Preventative Health Maintenance:  Encouraged her to get a flu shot in the fall Tetanus UTD Pap smear today- she declines STD screening Encouraged her to consume a balanced diet and exercise regimen Advised her to see a dentist annually Will check CBC, CMET, Lipid and A1C today  RTC in 3 weeks for follow up HTN Anyeli Hockenbury, NP

## 2016-08-06 NOTE — Patient Instructions (Signed)

## 2016-08-11 LAB — CYTOLOGY - PAP
DIAGNOSIS: NEGATIVE
HPV (WINDOPATH): NOT DETECTED

## 2016-09-03 ENCOUNTER — Ambulatory Visit: Payer: Medicare Other | Admitting: Internal Medicine

## 2016-09-03 ENCOUNTER — Telehealth: Payer: Self-pay | Admitting: Internal Medicine

## 2016-09-03 DIAGNOSIS — Z0289 Encounter for other administrative examinations: Secondary | ICD-10-CM

## 2016-09-03 NOTE — Telephone Encounter (Signed)
Patient did not come in for their appointment today for 3 week bp follow up.Please let me know if patient needs to be contacted immediately for follow up or no follow up needed. Do you want to charge the NSF?

## 2016-09-03 NOTE — Telephone Encounter (Signed)
Yes she needs to follow up. Yes charge NSF

## 2016-09-16 ENCOUNTER — Other Ambulatory Visit: Payer: Self-pay | Admitting: Internal Medicine

## 2016-09-18 ENCOUNTER — Ambulatory Visit: Payer: Medicare Other | Admitting: Internal Medicine

## 2016-09-22 ENCOUNTER — Ambulatory Visit: Payer: Medicare Other | Admitting: Internal Medicine

## 2016-09-22 ENCOUNTER — Telehealth: Payer: Self-pay | Admitting: Internal Medicine

## 2016-09-22 DIAGNOSIS — Z0289 Encounter for other administrative examinations: Secondary | ICD-10-CM

## 2016-09-22 NOTE — Telephone Encounter (Signed)
Patient did not come in for their appointment today for bp follow up Please let me know if patient needs to be contacted immediately for follow up or no follow up needed. Do you want to charge the NSF? °

## 2016-09-22 NOTE — Telephone Encounter (Signed)
Yes, she needs to follow up, and yes charge the NSF

## 2016-09-22 NOTE — Telephone Encounter (Signed)
Scheduled 07/17

## 2016-10-12 ENCOUNTER — Encounter: Payer: Self-pay | Admitting: Internal Medicine

## 2016-10-12 NOTE — Telephone Encounter (Signed)
Sent letter to pt to reschedule appt  °

## 2016-10-22 ENCOUNTER — Other Ambulatory Visit: Payer: Self-pay | Admitting: Internal Medicine

## 2016-10-31 ENCOUNTER — Encounter: Payer: Self-pay | Admitting: Internal Medicine

## 2016-12-01 ENCOUNTER — Other Ambulatory Visit: Payer: Self-pay | Admitting: Internal Medicine

## 2016-12-14 ENCOUNTER — Encounter: Payer: Self-pay | Admitting: Internal Medicine

## 2016-12-14 ENCOUNTER — Ambulatory Visit (INDEPENDENT_AMBULATORY_CARE_PROVIDER_SITE_OTHER): Payer: Medicare Other | Admitting: Internal Medicine

## 2016-12-14 DIAGNOSIS — I10 Essential (primary) hypertension: Secondary | ICD-10-CM

## 2016-12-14 MED ORDER — CETIRIZINE HCL 10 MG PO TABS: 10.0000 mg | ORAL_TABLET | Freq: Every day | ORAL | 11 refills | Status: DC

## 2016-12-14 MED ORDER — AMLODIPINE BESY-BENAZEPRIL HCL 10-20 MG PO CAPS: 1.0000 | ORAL_CAPSULE | Freq: Every day | ORAL | 0 refills | Status: DC

## 2016-12-14 MED ORDER — IBUPROFEN 600 MG PO TABS: 600.0000 mg | ORAL_TABLET | Freq: Three times a day (TID) | ORAL | 0 refills | Status: DC | PRN

## 2016-12-14 NOTE — Patient Instructions (Signed)
DASH Eating Plan DASH stands for "Dietary Approaches to Stop Hypertension." The DASH eating plan is a healthy eating plan that has been shown to reduce high blood pressure (hypertension). It may also reduce your risk for type 2 diabetes, heart disease, and stroke. The DASH eating plan may also help with weight loss. What are tips for following this plan? General guidelines  Avoid eating more than 2,300 mg (milligrams) of salt (sodium) a day. If you have hypertension, you may need to reduce your sodium intake to 1,500 mg a day.  Limit alcohol intake to no more than 1 drink a day for nonpregnant women and 2 drinks a day for men. One drink equals 12 oz of beer, 5 oz of wine, or 1 oz of hard liquor.  Work with your health care provider to maintain a healthy body weight or to lose weight. Ask what an ideal weight is for you.  Get at least 30 minutes of exercise that causes your heart to beat faster (aerobic exercise) most days of the week. Activities may include walking, swimming, or biking.  Work with your health care provider or diet and nutrition specialist (dietitian) to adjust your eating plan to your individual calorie needs. Reading food labels  Check food labels for the amount of sodium per serving. Choose foods with less than 5 percent of the Daily Value of sodium. Generally, foods with less than 300 mg of sodium per serving fit into this eating plan.  To find whole grains, look for the word "whole" as the first word in the ingredient list. Shopping  Buy products labeled as "low-sodium" or "no salt added."  Buy fresh foods. Avoid canned foods and premade or frozen meals. Cooking  Avoid adding salt when cooking. Use salt-free seasonings or herbs instead of table salt or sea salt. Check with your health care provider or pharmacist before using salt substitutes.  Do not fry foods. Cook foods using healthy methods such as baking, boiling, grilling, and broiling instead.  Cook with  heart-healthy oils, such as olive, canola, soybean, or sunflower oil. Meal planning   Eat a balanced diet that includes: ? 5 or more servings of fruits and vegetables each day. At each meal, try to fill half of your plate with fruits and vegetables. ? Up to 6-8 servings of whole grains each day. ? Less than 6 oz of lean meat, poultry, or fish each day. A 3-oz serving of meat is about the same size as a deck of cards. One egg equals 1 oz. ? 2 servings of low-fat dairy each day. ? A serving of nuts, seeds, or beans 5 times each week. ? Heart-healthy fats. Healthy fats called Omega-3 fatty acids are found in foods such as flaxseeds and coldwater fish, like sardines, salmon, and mackerel.  Limit how much you eat of the following: ? Canned or prepackaged foods. ? Food that is high in trans fat, such as fried foods. ? Food that is high in saturated fat, such as fatty meat. ? Sweets, desserts, sugary drinks, and other foods with added sugar. ? Full-fat dairy products.  Do not salt foods before eating.  Try to eat at least 2 vegetarian meals each week.  Eat more home-cooked food and less restaurant, buffet, and fast food.  When eating at a restaurant, ask that your food be prepared with less salt or no salt, if possible. What foods are recommended? The items listed may not be a complete list. Talk with your dietitian about what   dietary choices are best for you. Grains Whole-grain or whole-wheat bread. Whole-grain or whole-wheat pasta. Brown rice. Oatmeal. Quinoa. Bulgur. Whole-grain and low-sodium cereals. Pita bread. Low-fat, low-sodium crackers. Whole-wheat flour tortillas. Vegetables Fresh or frozen vegetables (raw, steamed, roasted, or grilled). Low-sodium or reduced-sodium tomato and vegetable juice. Low-sodium or reduced-sodium tomato sauce and tomato paste. Low-sodium or reduced-sodium canned vegetables. Fruits All fresh, dried, or frozen fruit. Canned fruit in natural juice (without  added sugar). Meat and other protein foods Skinless chicken or turkey. Ground chicken or turkey. Pork with fat trimmed off. Fish and seafood. Egg whites. Dried beans, peas, or lentils. Unsalted nuts, nut butters, and seeds. Unsalted canned beans. Lean cuts of beef with fat trimmed off. Low-sodium, lean deli meat. Dairy Low-fat (1%) or fat-free (skim) milk. Fat-free, low-fat, or reduced-fat cheeses. Nonfat, low-sodium ricotta or cottage cheese. Low-fat or nonfat yogurt. Low-fat, low-sodium cheese. Fats and oils Soft margarine without trans fats. Vegetable oil. Low-fat, reduced-fat, or light mayonnaise and salad dressings (reduced-sodium). Canola, safflower, olive, soybean, and sunflower oils. Avocado. Seasoning and other foods Herbs. Spices. Seasoning mixes without salt. Unsalted popcorn and pretzels. Fat-free sweets. What foods are not recommended? The items listed may not be a complete list. Talk with your dietitian about what dietary choices are best for you. Grains Baked goods made with fat, such as croissants, muffins, or some breads. Dry pasta or rice meal packs. Vegetables Creamed or fried vegetables. Vegetables in a cheese sauce. Regular canned vegetables (not low-sodium or reduced-sodium). Regular canned tomato sauce and paste (not low-sodium or reduced-sodium). Regular tomato and vegetable juice (not low-sodium or reduced-sodium). Pickles. Olives. Fruits Canned fruit in a light or heavy syrup. Fried fruit. Fruit in cream or butter sauce. Meat and other protein foods Fatty cuts of meat. Ribs. Fried meat. Bacon. Sausage. Bologna and other processed lunch meats. Salami. Fatback. Hotdogs. Bratwurst. Salted nuts and seeds. Canned beans with added salt. Canned or smoked fish. Whole eggs or egg yolks. Chicken or turkey with skin. Dairy Whole or 2% milk, cream, and half-and-half. Whole or full-fat cream cheese. Whole-fat or sweetened yogurt. Full-fat cheese. Nondairy creamers. Whipped toppings.  Processed cheese and cheese spreads. Fats and oils Butter. Stick margarine. Lard. Shortening. Ghee. Bacon fat. Tropical oils, such as coconut, palm kernel, or palm oil. Seasoning and other foods Salted popcorn and pretzels. Onion salt, garlic salt, seasoned salt, table salt, and sea salt. Worcestershire sauce. Tartar sauce. Barbecue sauce. Teriyaki sauce. Soy sauce, including reduced-sodium. Steak sauce. Canned and packaged gravies. Fish sauce. Oyster sauce. Cocktail sauce. Horseradish that you find on the shelf. Ketchup. Mustard. Meat flavorings and tenderizers. Bouillon cubes. Hot sauce and Tabasco sauce. Premade or packaged marinades. Premade or packaged taco seasonings. Relishes. Regular salad dressings. Where to find more information:  National Heart, Lung, and Blood Institute: www.nhlbi.nih.gov  American Heart Association: www.heart.org Summary  The DASH eating plan is a healthy eating plan that has been shown to reduce high blood pressure (hypertension). It may also reduce your risk for type 2 diabetes, heart disease, and stroke.  With the DASH eating plan, you should limit salt (sodium) intake to 2,300 mg a day. If you have hypertension, you may need to reduce your sodium intake to 1,500 mg a day.  When on the DASH eating plan, aim to eat more fresh fruits and vegetables, whole grains, lean proteins, low-fat dairy, and heart-healthy fats.  Work with your health care provider or diet and nutrition specialist (dietitian) to adjust your eating plan to your individual   calorie needs. This information is not intended to replace advice given to you by your health care provider. Make sure you discuss any questions you have with your health care provider. Document Released: 02/12/2011 Document Revised: 02/17/2016 Document Reviewed: 02/17/2016 Elsevier Interactive Patient Education  2017 [employer]  

## 2016-12-14 NOTE — Assessment & Plan Note (Signed)
Still uncontrolled Increase Benazepril to 10-20 mg daily Reinforced DASH diet and exercise for weight loss

## 2016-12-14 NOTE — Progress Notes (Signed)
Subjective:    Patient ID: Nancy Mcgrath, female    DOB: 1980/04/04, 36 y.o.   MRN: [id number]  HPI  Pt presents to the clinic today for follow up of HTN. At her last visit, she was started on Amlodipine-Benzepril. She has been taking the medication as prescribed. She denies adverse side effects. Her BP today is 144/98.  She also c/o a sore throat and cough. This started 2 weeks ago. The cough is productive of yellow mucous. She denies runny nose, nasal congestion and ear pain., She denies fever, chills or body aches. She has taken lots of cold and flu medication OTC without much relief.  She has no history of seasonal allergies. She has not had sick contacts.  Review of Systems      Past Medical History:  Diagnosis Date  . Anxiety   . Depression   . Hypertension     Current Outpatient Prescriptions  Medication Sig Dispense Refill  . ALPRAZolam (XANAX) 0.5 MG tablet Take 0.5 mg by mouth at bedtime as needed for anxiety.    Marland Kitchen amLODipine-benazepril (LOTREL) 5-10 MG capsule TAKE 1 CAPSULE BY MOUTH DAILY. MUST SCHEDULE FOLLOW UP VISIT 30 capsule 0  . escitalopram (LEXAPRO) 20 MG tablet Take 2 tablets (40 mg total) by mouth daily.    . Multiple Vitamin (MULTIVITAMIN) tablet Take 1 tablet by mouth daily.    Marland Kitchen zolpidem (AMBIEN) 5 MG tablet Take 5 mg by mouth at bedtime.  2   No current facility-administered medications for this visit.     No Known Allergies  Family History  Problem Relation Age of Onset  . Diabetes Paternal Grandmother   . Cancer Neg Hx   . Heart disease Neg Hx   . Stroke Neg Hx     Social History   Social History  . Marital status: Single    Spouse name: N/A  . Number of children: N/A  . Years of education: N/A   Occupational History  . Not on file.   Social History Main Topics  . Smoking status: Current Every Day Smoker    Packs/day: 1.00    Years: 15.00    Types: Cigarettes  . Smokeless tobacco: Never Used  . Alcohol use No  . Drug use: Yes      Types: Marijuana  . Sexual activity: No   Other Topics Concern  . Not on file   Social History Narrative  . No narrative on file     Constitutional: Denies fever, malaise, fatigue, headache or abrupt weight changes.  HEENT: Pt reports sore throat. Denies eye pain, eye redness, ear pain, ringing in the ears, wax buildup, runny nose, nasal congestion, bloody nose Respiratory: Pt reports cough. Denies difficulty breathing, shortness of breath, or sputum production.   Cardiovascular: Denies chest pain, chest tightness, palpitations or swelling in the hands or feet.  Gastrointestinal: Denies abdominal pain, bloating, constipation, diarrhea or blood in the stool.  Neurological: Denies dizziness, difficulty with memory, difficulty with speech or problems with balance and coordination.    No other specific complaints in a complete review of systems (except as listed in HPI above).  Objective:   Physical Exam   BP (!) 144/98   Pulse 84   Temp 98.5 F (36.9 C) (Oral)   Wt 228 lb 12 oz (103.8 kg)   LMP 12/10/2016   SpO2 99%   BMI 41.84 kg/m  Wt Readings from Last 3 Encounters:  12/14/16 228 lb 12 oz (103.8 kg)  08/06/16  220 lb 8 oz (100 kg)  07/14/16 219 lb (99.3 kg)    General: Appears her stated age, obese in NAD. HEENT: Head: normal shape and size, no sinus tenderness noted; Ears: Tm's gray and intact, normal light reflex; Nose: mucosa pink and moist, septum midline; Throat/Mouth: Teeth present, mucosa pink and moist, + PND, no exudate, lesions or ulcerations noted.  Neck:  No adenopathy noted.  Cardiovascular: Normal rate and rhythm.  Pulmonary/Chest: Normal effort and diminished vesicular breath sounds. No respiratory distress. No wheezes, rales or ronchi noted.  Neurological: Alert and oriented.   BMET    Component Value Date/Time   NA 138 08/06/2016 0942   K 3.8 08/06/2016 0942   CL 105 08/06/2016 0942   CO2 29 08/06/2016 0942   GLUCOSE 102 (H) 08/06/2016 0942    BUN 11 08/06/2016 0942   CREATININE 0.88 08/06/2016 0942   CREATININE 0.99 10/24/2015 1154   CALCIUM 9.3 08/06/2016 0942   GFRNONAA >60 07/08/2016 2030   GFRNONAA 76 10/24/2015 1154   GFRAA >60 07/08/2016 2030   GFRAA 87 10/24/2015 1154    Lipid Panel     Component Value Date/Time   CHOL 209 (H) 08/06/2016 0942   TRIG 92.0 08/06/2016 0942   HDL 40.30 08/06/2016 0942   CHOLHDL 5 08/06/2016 0942   VLDL 18.4 08/06/2016 0942   LDLCALC 151 (H) 08/06/2016 0942    CBC    Component Value Date/Time   WBC 9.8 08/06/2016 0942   RBC 4.75 08/06/2016 0942   HGB 13.1 08/06/2016 0942   HCT 40.0 08/06/2016 0942   PLT 357.0 08/06/2016 0942   MCV 84.2 08/06/2016 0942   MCH 28.1 07/08/2016 2030   MCHC 32.9 08/06/2016 0942   RDW 14.6 08/06/2016 0942   LYMPHSABS 4.0 03/02/2013 1328   MONOABS 0.6 03/02/2013 1328   EOSABS 0.1 03/02/2013 1328   BASOSABS 0.0 03/02/2013 1328    Hgb A1C Lab Results  Component Value Date   HGBA1C 6.2 08/06/2016          Assessment & Plan:   Allergic Rhinitis:  Advised her to try Zyrtec OTC  RTC in 2 weeks for BP follow up

## 2016-12-15 ENCOUNTER — Emergency Department (HOSPITAL_COMMUNITY)
Admission: EM | Admit: 2016-12-15 | Discharge: 2016-12-16 | Disposition: A | Discharge status: Home / Self Care | Payer: Medicare Other | Attending: Emergency Medicine | Admitting: Emergency Medicine

## 2016-12-15 ENCOUNTER — Encounter (HOSPITAL_COMMUNITY): Payer: Self-pay | Admitting: Emergency Medicine

## 2016-12-15 DIAGNOSIS — T401X1A Poisoning by heroin, accidental (unintentional), initial encounter: Secondary | ICD-10-CM | POA: Diagnosis not present

## 2016-12-15 DIAGNOSIS — Z79899 Other long term (current) drug therapy: Secondary | ICD-10-CM | POA: Diagnosis not present

## 2016-12-15 DIAGNOSIS — I10 Essential (primary) hypertension: Secondary | ICD-10-CM | POA: Diagnosis not present

## 2016-12-15 DIAGNOSIS — T50904A Poisoning by unspecified drugs, medicaments and biological substances, undetermined, initial encounter: Secondary | ICD-10-CM | POA: Diagnosis not present

## 2016-12-15 DIAGNOSIS — F17210 Nicotine dependence, cigarettes, uncomplicated: Secondary | ICD-10-CM | POA: Insufficient documentation

## 2016-12-15 DIAGNOSIS — R4182 Altered mental status, unspecified: Secondary | ICD-10-CM | POA: Diagnosis present

## 2016-12-15 LAB — COMPREHENSIVE METABOLIC PANEL
ALBUMIN: 3.6 g/dL (ref 3.5–5.0)
ALK PHOS: 70 U/L (ref 38–126)
ALT: 16 U/L (ref 14–54)
ANION GAP: 7 (ref 5–15)
AST: 30 U/L (ref 15–41)
BUN: 7 mg/dL (ref 6–20)
CALCIUM: 8.6 mg/dL — AB (ref 8.9–10.3)
CHLORIDE: 100 mmol/L — AB (ref 101–111)
CO2: 28 mmol/L (ref 22–32)
CREATININE: 0.82 mg/dL (ref 0.44–1.00)
GFR calc Af Amer: >60 mL/min (ref >60)
GFR calc non Af Amer: >60 mL/min (ref >60)
GLUCOSE: 105 mg/dL — AB (ref 65–99)
Potassium: 3.4 mmol/L — ABNORMAL LOW (ref 3.5–5.1)
SODIUM: 135 mmol/L (ref 135–145)
Total Bilirubin: 0.6 mg/dL (ref 0.3–1.2)
Total Protein: 7.2 g/dL (ref 6.5–8.1)

## 2016-12-15 LAB — CBC
HCT: 36.4 % (ref 36.0–46.0)
Hemoglobin: 11.5 g/dL — ABNORMAL LOW (ref 12.0–15.0)
MCH: 27.3 pg (ref 26.0–34.0)
MCHC: 31.6 g/dL (ref 30.0–36.0)
MCV: 86.3 fL (ref 78.0–100.0)
PLATELETS: 331 10*3/uL (ref 150–400)
RBC: 4.22 MIL/uL (ref 3.87–5.11)
RDW: 13.9 % (ref 11.5–15.5)
WBC: 10.8 10*3/uL — ABNORMAL HIGH (ref 4.0–10.5)

## 2016-12-15 LAB — RAPID URINE DRUG SCREEN, HOSP PERFORMED
AMPHETAMINES: NOT DETECTED
BENZODIAZEPINES: POSITIVE — AB
Barbiturates: NOT DETECTED
COCAINE: NOT DETECTED
Opiates: POSITIVE — AB
Tetrahydrocannabinol: POSITIVE — AB

## 2016-12-15 LAB — SALICYLATE LEVEL: Salicylate Lvl: <7 mg/dL (ref 2.8–30.0)

## 2016-12-15 LAB — I-STAT BETA HCG BLOOD, ED (MC, WL, AP ONLY)

## 2016-12-15 LAB — ACETAMINOPHEN LEVEL: Acetaminophen (Tylenol), Serum: <10 ug/mL — ABNORMAL LOW (ref 10–30)

## 2016-12-15 LAB — ETHANOL

## 2016-12-15 MED ORDER — SODIUM CHLORIDE 0.9 % IV BOLUS (SEPSIS)
1000.0000 mL | Freq: Once | INTRAVENOUS | Status: AC
  Administered 2016-12-15: 1000 mL via INTRAVENOUS

## 2016-12-15 MED ORDER — SODIUM CHLORIDE 0.9 % IV SOLN
INTRAVENOUS | Status: DC
Start: 2016-12-15 — End: 2016-12-16
  Administered 2016-12-15: 22:00:00 via INTRAVENOUS

## 2016-12-15 NOTE — ED Notes (Signed)
ED Provider at bedside. 

## 2016-12-15 NOTE — ED Provider Notes (Signed)
[facility]-EMERGENCY DEPT Provider Note   CSN: [id number] Arrival date & time: 12/15/16  1945     History   Chief Complaint Chief Complaint  Patient presents with  . Drug Overdose    HPI Nancy Mcgrath is a 36 y.o. female.  The history is provided by the patient and medical records. No language interpreter was used.  Drug Overdose    Nancy Mcgrath is a 36 y.o. female  with a PMH of HTN, anxiety, depression who presents to the Emergency Department via EMS for accidental overdose. Patient states that she was with friends and took heroin. She was doing this to get high with no intention of overdose. He states that her first time ever doing heroin was on Sunday. She did it again tonight, for the second time, and took the same amount as her friends who were with her. She thought this would be fine. She denies alcohol use or any other ingestants tonight. Per EMS, patient was found unresponsive on seen. 2 mg of Narcan given and rescue breathing performed for around 6 minutes. Another 2 mg of Narcan was administered and patient became alert. During my examination, patient states that she is tired, but with no other complaints.  Past Medical History:  Diagnosis Date  . Anxiety   . Depression   . Hypertension     Patient Active Problem List   Diagnosis Date Noted  . Essential hypertension 07/14/2016  . Anxiety and depression 09/02/2015    History reviewed. No pertinent surgical history.  OB History    No data available       Home Medications    Prior to Admission medications   Medication Sig Start Date End Date Taking? Authorizing Provider  ALPRAZolam Prudy Feeler) 0.5 MG tablet Take 0.5 mg by mouth at bedtime as needed for anxiety.   Yes [provider]  amLODipine-benazepril (LOTREL) 10-20 MG capsule Take 1 capsule by mouth daily. Patient taking differently: Take 1 capsule by mouth at bedtime.  12/14/16  Yes Lorre Munroe, NP  cetirizine (ZYRTEC) 10 MG tablet Take 1 tablet  (10 mg total) by mouth daily. 12/14/16  Yes Lorre Munroe, NP  escitalopram (LEXAPRO) 20 MG tablet Take 40 mg by mouth at bedtime.  07/17/16  Yes Baity, Salvadore Oxford, NP  ibuprofen (ADVIL,MOTRIN) 600 MG tablet Take 1 tablet (600 mg total) by mouth every 8 (eight) hours as needed. Patient taking differently: Take 600 mg by mouth every 8 (eight) hours as needed for mild pain.  12/14/16  Yes Lorre Munroe, NP    Family History Family History  Problem Relation Age of Onset  . Diabetes Paternal Grandmother   . Cancer Neg Hx   . Heart disease Neg Hx   . Stroke Neg Hx     Social History Social History  Substance Use Topics  . Smoking status: Current Every Day Smoker    Packs/day: 1.00    Years: 15.00    Types: Cigarettes  . Smokeless tobacco: Never Used  . Alcohol use No     Allergies   Patient has no known allergies.   Review of Systems Review of Systems  Unable to perform ROS: Acuity of condition  Constitutional: Positive for fatigue.     Physical Exam Updated Vital Signs BP (!) 149/97   Pulse 68   Temp 98.8 F (37.1 C) (Oral)   Resp 12   LMP 12/10/2016   SpO2 94%   Physical Exam  Constitutional: She appears well-developed and well-nourished.  No distress.  HENT:  Head: Normocephalic and atraumatic.  Cardiovascular: Normal rate, regular rhythm and normal heart sounds.   No murmur heard. Pulmonary/Chest: Effort normal and breath sounds normal. No respiratory distress.  Abdominal: Soft. She exhibits no distension. There is no tenderness.  Musculoskeletal: She exhibits no edema.  Neurological:  Drowsy but arousable. Alert and oriented 4. Able to give history of tonight's events. CN 2-12 grossly intact. Moves all extremities spontaneously.  Skin: Skin is warm and dry.  Nursing note and vitals reviewed.    ED Treatments / Results  Labs (all labs ordered are listed, but only abnormal results are displayed) Labs Reviewed  COMPREHENSIVE METABOLIC PANEL - Abnormal;  Notable for the following:       Result Value   Potassium 3.4 (*)    Chloride 100 (*)    Glucose, Bld 105 (*)    Calcium 8.6 (*)    All other components within normal limits  ACETAMINOPHEN LEVEL - Abnormal; Notable for the following:    Acetaminophen (Tylenol), Serum <10 (*)    All other components within normal limits  RAPID URINE DRUG SCREEN, HOSP PERFORMED - Abnormal; Notable for the following:    Opiates POSITIVE (*)    Benzodiazepines POSITIVE (*)    Tetrahydrocannabinol POSITIVE (*)    All other components within normal limits  CBC - Abnormal; Notable for the following:    WBC 10.8 (*)    Hemoglobin 11.5 (*)    All other components within normal limits  SALICYLATE LEVEL  ETHANOL  I-STAT BETA HCG BLOOD, ED ([facility], [facility], [facility] ONLY)    EKG  EKG Interpretation None       Radiology No results found.  Procedures Procedures (including critical care time)  Medications Ordered in ED Medications  sodium chloride 0.9 % bolus 1,000 mL (0 mLs Intravenous Stopped 12/15/16 2222)    And  0.9 %  sodium chloride infusion ( Intravenous New Bag/Given 12/15/16 2216)     Initial Impression / Assessment and Plan / ED Course  I have reviewed the triage vital signs and the nursing notes.  Pertinent labs & imaging results that were available during my care of the patient were reviewed by me and considered in my medical decision making (see chart for details).    Nancy Mcgrath is a 36 y.o. female who presents to ED for Accidental heroin overdose. Per EMS, patient was unresponsive on the scene. After 4 mg of Narcan were given, patient became alert and oriented. Upon my initial evaluation, patient was drowsy, but arousable. She was able to follow commands and answer questions appropriately. Labs reviewed and reassuring. Patient hydrated with IV fluids. Patient monitored in the emergency department for 5 hours. At final reassessment, patient was ambulatory independently with steady gait. Discussed  severity of today's accidental overdose. Patient appears remorseful. This was accidental with no suicidal intention. Evaluation does not show pathology that would require ongoing emergent intervention or inpatient treatment. Discharged to home in satisfactory condition.  Patient discussed with Dr. Silverio Lay who agrees with treatment plan.    Final Clinical Impressions(s) / ED Diagnoses   Final diagnoses:  Accidental overdose of heroin, initial encounter [facility])    New Prescriptions New Prescriptions   No medications on file     Ward, Chase Picket, PA-C 12/16/16 1610    Charlynne Pander, MD 12/16/16 [phone]

## 2016-12-15 NOTE — ED Triage Notes (Addendum)
Per EMS: Pt from home with narcotic overdose.  Pt admits to using heroin but denies any other drug use.  Upon EMS arrival on scene, pt found unresponsive.  2 mg of Narcan given, rescue breathing performed for approx 6 minutes, another  of Narcan administered.  At that time pt became alert.  PTA vitals: BP 146/92, HR 86, Sp02 97% RA.  Pt NSR on monitor.

## 2016-12-16 NOTE — Discharge Instructions (Signed)
It was my pleasure taking care of you today!   You were seen in the emergency department tonight after overdosing on heroin. This was a very serious event. Please try your best to stay away from heroin in the future.  Return to ER for new or worsening symptoms, any additional concerns.

## 2016-12-16 NOTE — ED Notes (Signed)
Patient ambulated hallway with stand by assistance .

## 2016-12-28 ENCOUNTER — Ambulatory Visit: Payer: Medicare Other | Admitting: Internal Medicine

## 2016-12-28 DIAGNOSIS — Z0289 Encounter for other administrative examinations: Secondary | ICD-10-CM

## 2016-12-28 NOTE — Progress Notes (Deleted)
   Subjective:    Patient ID: Nancy Mcgrath, female    DOB: [birth year], 36 y.o.   MRN: [id number]  HPI  Pt presents to the clinic today for follow up of HTN. At her last visit, her Amlodipine-Benazepril was increased to 10-20 mg daily. She has been taking the medication as prescribed. She denies adverse effects. Her BP today is. ECG from 12/2016 reviewed.  Review of Systems      Past Medical History:  Diagnosis Date  . Anxiety   . Depression   . Hypertension     Current Outpatient Prescriptions  Medication Sig Dispense Refill  . ALPRAZolam (XANAX) 0.5 MG tablet Take 0.5 mg by mouth at bedtime as needed for anxiety.    Marland Kitchen. amLODipine-benazepril (LOTREL) 10-20 MG capsule Take 1 capsule by mouth daily. (Patient taking differently: Take 1 capsule by mouth at bedtime. ) 30 capsule 0  . cetirizine (ZYRTEC) 10 MG tablet Take 1 tablet (10 mg total) by mouth daily. 30 tablet 11  . escitalopram (LEXAPRO) 20 MG tablet Take 40 mg by mouth at bedtime.     Marland Kitchen. ibuprofen (ADVIL,MOTRIN) 600 MG tablet Take 1 tablet (600 mg total) by mouth every 8 (eight) hours as needed. (Patient taking differently: Take 600 mg by mouth every 8 (eight) hours as needed for mild pain. ) 30 tablet 0   No current facility-administered medications for this visit.     No Known Allergies  Family History  Problem Relation Age of Onset  . Diabetes Paternal Grandmother   . Cancer Neg Hx   . Heart disease Neg Hx   . Stroke Neg Hx     Social History   Social History  . Marital status: Single    Spouse name: N/A  . Number of children: N/A  . Years of education: N/A   Occupational History  . Not on file.   Social History Main Topics  . Smoking status: Current Every Day Smoker    Packs/day: 1.00    Years: 15.00    Types: Cigarettes  . Smokeless tobacco: Never Used  . Alcohol use No  . Drug use: Yes    Types: Marijuana  . Sexual activity: No   Other Topics Concern  . Not on file   Social History Narrative    . No narrative on file     Constitutional: Denies fever, malaise, fatigue, headache or abrupt weight changes.  HEENT: Denies eye pain, eye redness, ear pain, ringing in the ears, wax buildup, runny nose, nasal congestion, bloody nose, or sore throat. Respiratory: Denies difficulty breathing, shortness of breath, cough or sputum production.   Cardiovascular: Denies chest pain, chest tightness, palpitations or swelling in the hands or feet.  Gastrointestinal: Denies abdominal pain, bloating, constipation, diarrhea or blood in the stool.  GU: Denies urgency, frequency, pain with urination, burning sensation, blood in urine, odor or discharge. Musculoskeletal: Denies decrease in range of motion, difficulty with gait, muscle pain or joint pain and swelling.  Skin: Denies redness, rashes, lesions or ulcercations.  Neurological: Denies dizziness, difficulty with memory, difficulty with speech or problems with balance and coordination.  Psych: Denies anxiety, depression, SI/HI.  No other specific complaints in a complete review of systems (except as listed in HPI above).  Objective:   Physical Exam        Assessment & Plan:

## 2017-01-10 ENCOUNTER — Other Ambulatory Visit: Payer: Self-pay | Admitting: Internal Medicine

## 2017-01-11 NOTE — Telephone Encounter (Signed)
Left message for patient to call our office to make a follow up appt to proceed with refill.

## 2017-01-25 DIAGNOSIS — R030 Elevated blood-pressure reading, without diagnosis of hypertension: Secondary | ICD-10-CM | POA: Diagnosis not present

## 2017-01-25 DIAGNOSIS — J208 Acute bronchitis due to other specified organisms: Secondary | ICD-10-CM | POA: Diagnosis not present

## 2017-01-25 DIAGNOSIS — J019 Acute sinusitis, unspecified: Secondary | ICD-10-CM | POA: Diagnosis not present

## 2017-03-26 IMAGING — DX DG FINGER RING 2+V*R*
3 series · 3 of 3 positions shown · non-contrast
Comparison: September 02, 2015

CLINICAL DATA: Known fracture fourth distal phalanx. Persistent
pain

EXAM:
RIGHT FOURTH FINGER 2+V

[finger ap]
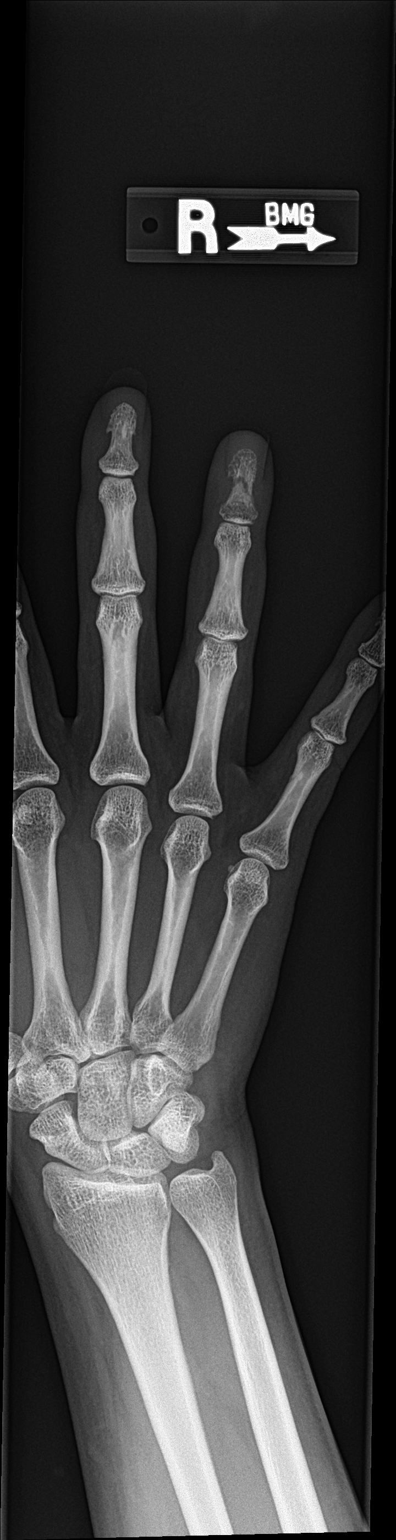

[finger obl]
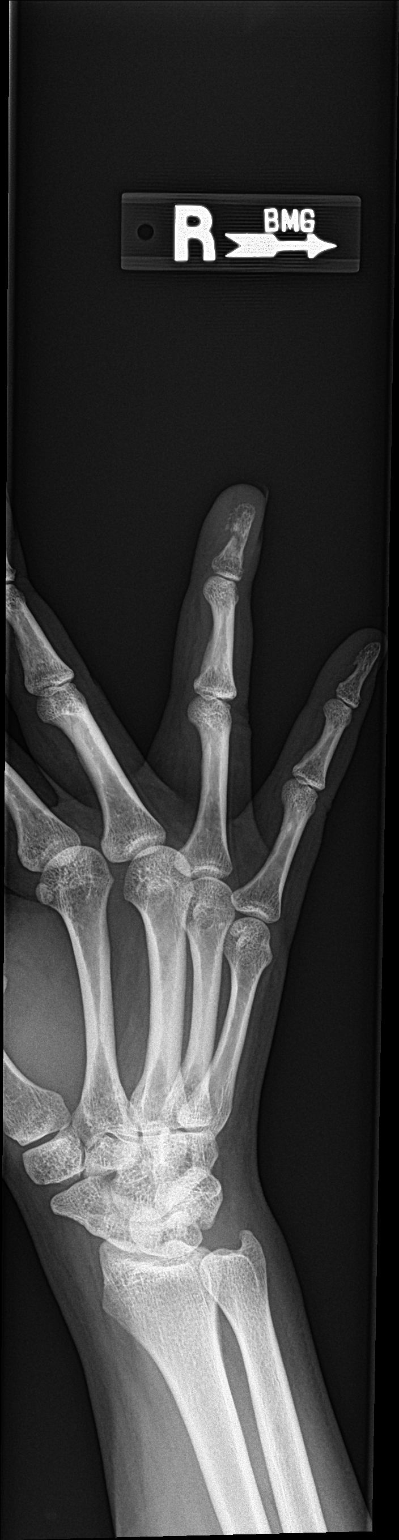

[finger lat]
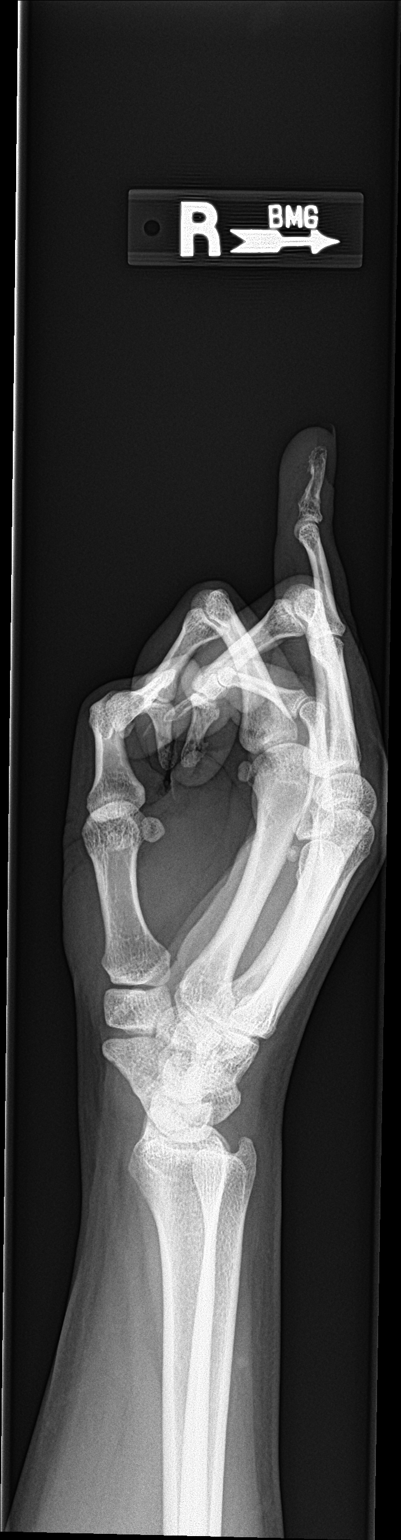

[3 of 3 positions shown; findings below may reference images not displayed]

FINDINGS: Frontal, oblique, and lateral views obtained. The fracture of the
fourth distal phalanx is again noted without appreciable change in
alignment. There is no appreciable callus formation in this area
compared to prior study. No new fracture. No dislocation. Joint
spaces appear normal.
IMPRESSION: Fracture of the midportion of the fourth distal phalanx appears
unchanged in alignment. Essentially no callus formation seen to
indicate healing. No new fracture. No dislocation. No apparent
arthropathy.

These results will be called to the ordering clinician or
representative by the Radiologist Assistant, and communication
documented in the PACS or zVision Dashboard.

## 2017-05-10 DIAGNOSIS — Z716 Tobacco abuse counseling: Secondary | ICD-10-CM | POA: Diagnosis not present

## 2017-05-10 DIAGNOSIS — E559 Vitamin D deficiency, unspecified: Secondary | ICD-10-CM | POA: Diagnosis not present

## 2017-05-10 DIAGNOSIS — I10 Essential (primary) hypertension: Secondary | ICD-10-CM | POA: Diagnosis not present

## 2017-05-10 DIAGNOSIS — F329 Major depressive disorder, single episode, unspecified: Secondary | ICD-10-CM | POA: Diagnosis not present

## 2017-05-10 DIAGNOSIS — R69 Illness, unspecified: Secondary | ICD-10-CM | POA: Diagnosis not present

## 2017-05-10 DIAGNOSIS — E7800 Pure hypercholesterolemia, unspecified: Secondary | ICD-10-CM | POA: Diagnosis not present

## 2017-05-17 DIAGNOSIS — N946 Dysmenorrhea, unspecified: Secondary | ICD-10-CM | POA: Diagnosis not present

## 2017-05-17 DIAGNOSIS — Z716 Tobacco abuse counseling: Secondary | ICD-10-CM | POA: Diagnosis not present

## 2017-05-17 DIAGNOSIS — E7800 Pure hypercholesterolemia, unspecified: Secondary | ICD-10-CM | POA: Diagnosis not present

## 2017-05-17 DIAGNOSIS — I10 Essential (primary) hypertension: Secondary | ICD-10-CM | POA: Diagnosis not present

## 2017-05-17 DIAGNOSIS — R69 Illness, unspecified: Secondary | ICD-10-CM | POA: Diagnosis not present

## 2017-05-17 DIAGNOSIS — Z6837 Body mass index (BMI) 37.0-37.9, adult: Secondary | ICD-10-CM | POA: Diagnosis not present

## 2017-05-18 DIAGNOSIS — N946 Dysmenorrhea, unspecified: Secondary | ICD-10-CM | POA: Diagnosis not present

## 2017-05-18 DIAGNOSIS — Z0001 Encounter for general adult medical examination with abnormal findings: Secondary | ICD-10-CM | POA: Diagnosis not present

## 2017-05-18 DIAGNOSIS — Z6837 Body mass index (BMI) 37.0-37.9, adult: Secondary | ICD-10-CM | POA: Diagnosis not present

## 2017-05-18 DIAGNOSIS — Z1151 Encounter for screening for human papillomavirus (HPV): Secondary | ICD-10-CM | POA: Diagnosis not present

## 2017-05-18 DIAGNOSIS — Z01411 Encounter for gynecological examination (general) (routine) with abnormal findings: Secondary | ICD-10-CM | POA: Diagnosis not present

## 2017-05-18 DIAGNOSIS — Z01419 Encounter for gynecological examination (general) (routine) without abnormal findings: Secondary | ICD-10-CM | POA: Diagnosis not present

## 2017-05-18 DIAGNOSIS — Z716 Tobacco abuse counseling: Secondary | ICD-10-CM | POA: Diagnosis not present

## 2017-05-18 DIAGNOSIS — R69 Illness, unspecified: Secondary | ICD-10-CM | POA: Diagnosis not present

## 2017-05-18 DIAGNOSIS — N760 Acute vaginitis: Secondary | ICD-10-CM | POA: Diagnosis not present

## 2017-06-29 DIAGNOSIS — N858 Other specified noninflammatory disorders of uterus: Secondary | ICD-10-CM | POA: Diagnosis not present

## 2017-07-13 DIAGNOSIS — Z112 Encounter for screening for other bacterial diseases: Secondary | ICD-10-CM | POA: Diagnosis not present

## 2017-07-13 DIAGNOSIS — I10 Essential (primary) hypertension: Secondary | ICD-10-CM | POA: Diagnosis not present

## 2017-07-13 DIAGNOSIS — N946 Dysmenorrhea, unspecified: Secondary | ICD-10-CM | POA: Diagnosis not present

## 2017-07-13 DIAGNOSIS — R300 Dysuria: Secondary | ICD-10-CM | POA: Diagnosis not present

## 2017-07-13 DIAGNOSIS — Z30011 Encounter for initial prescription of contraceptive pills: Secondary | ICD-10-CM | POA: Diagnosis not present

## 2017-07-13 DIAGNOSIS — Z118 Encounter for screening for other infectious and parasitic diseases: Secondary | ICD-10-CM | POA: Diagnosis not present

## 2017-07-13 DIAGNOSIS — R69 Illness, unspecified: Secondary | ICD-10-CM | POA: Diagnosis not present

## 2017-07-13 DIAGNOSIS — Z79899 Other long term (current) drug therapy: Secondary | ICD-10-CM | POA: Diagnosis not present

## 2017-07-13 DIAGNOSIS — E669 Obesity, unspecified: Secondary | ICD-10-CM | POA: Diagnosis not present

## 2017-07-13 DIAGNOSIS — N390 Urinary tract infection, site not specified: Secondary | ICD-10-CM | POA: Diagnosis not present

## 2017-11-14 DIAGNOSIS — J0110 Acute frontal sinusitis, unspecified: Secondary | ICD-10-CM | POA: Diagnosis not present

## 2017-11-14 DIAGNOSIS — J22 Unspecified acute lower respiratory infection: Secondary | ICD-10-CM | POA: Diagnosis not present

## 2017-11-14 DIAGNOSIS — R05 Cough: Secondary | ICD-10-CM | POA: Diagnosis not present

## 2023-04-02 ENCOUNTER — Emergency Department
Admission: EM | Admit: 2023-04-02 | Discharge: 2023-04-02 | Disposition: A | Discharge status: Home / Self Care | Payer: Medicare Other | Attending: Emergency Medicine | Admitting: Emergency Medicine

## 2023-04-02 ENCOUNTER — Ambulatory Visit
Admission: RE | Admit: 2023-04-02 | Discharge: 2023-04-02 | Disposition: A | Discharge status: Home / Self Care | Payer: Medicare Other | Source: Ambulatory Visit

## 2023-04-02 ENCOUNTER — Other Ambulatory Visit: Payer: Self-pay

## 2023-04-02 VITALS — BP 121/85 | HR 100 | Temp 98.9°F | Resp 18

## 2023-04-02 DIAGNOSIS — I10 Essential (primary) hypertension: Secondary | ICD-10-CM | POA: Diagnosis not present

## 2023-04-02 DIAGNOSIS — T50995A Adverse effect of other drugs, medicaments and biological substances, initial encounter: Secondary | ICD-10-CM | POA: Diagnosis not present

## 2023-04-02 DIAGNOSIS — M7989 Other specified soft tissue disorders: Secondary | ICD-10-CM | POA: Diagnosis not present

## 2023-04-02 DIAGNOSIS — T500X5A Adverse effect of mineralocorticoids and their antagonists, initial encounter: Secondary | ICD-10-CM

## 2023-04-02 DIAGNOSIS — R600 Localized edema: Secondary | ICD-10-CM | POA: Diagnosis present

## 2023-04-02 LAB — CBC WITH DIFFERENTIAL/PLATELET
Abs Immature Granulocytes: 0.01 10*3/uL (ref 0.00–0.07)
Basophils Absolute: 0 10*3/uL (ref 0.0–0.1)
Basophils Relative: 0 %
Eosinophils Absolute: 0 10*3/uL (ref 0.0–0.5)
Eosinophils Relative: 0 %
HCT: 38.3 % (ref 36.0–46.0)
Hemoglobin: 12.4 g/dL (ref 12.0–15.0)
Immature Granulocytes: 0 %
Lymphocytes Relative: 28 %
Lymphs Abs: 2.1 10*3/uL (ref 0.7–4.0)
MCH: 29.7 pg (ref 26.0–34.0)
MCHC: 32.4 g/dL (ref 30.0–36.0)
MCV: 91.8 fL (ref 80.0–100.0)
Monocytes Absolute: 0.6 10*3/uL (ref 0.1–1.0)
Monocytes Relative: 8 %
Neutro Abs: 4.6 10*3/uL (ref 1.7–7.7)
Neutrophils Relative %: 64 %
Platelets: 256 10*3/uL (ref 150–400)
RBC: 4.17 MIL/uL (ref 3.87–5.11)
RDW: 13 % (ref 11.5–15.5)
WBC: 7.3 10*3/uL (ref 4.0–10.5)
nRBC: 0 % (ref 0.0–0.2)

## 2023-04-02 LAB — BASIC METABOLIC PANEL
Anion gap: 11 (ref 5–15)
BUN: 7 mg/dL (ref 6–20)
CO2: 21 mmol/L — ABNORMAL LOW (ref 22–32)
Calcium: 9.1 mg/dL (ref 8.9–10.3)
Chloride: 106 mmol/L (ref 98–111)
Creatinine, Ser: 0.97 mg/dL (ref 0.44–1.00)
GFR, Estimated: >60 mL/min (ref >60)
Glucose, Bld: 114 mg/dL — ABNORMAL HIGH (ref 70–99)
Potassium: 3.9 mmol/L (ref 3.5–5.1)
Sodium: 138 mmol/L (ref 135–145)

## 2023-04-02 LAB — BRAIN NATRIURETIC PEPTIDE: B Natriuretic Peptide: 12.9 pg/mL (ref 0.0–100.0)

## 2023-04-02 LAB — SEDIMENTATION RATE: Sed Rate: 29 mm/h — ABNORMAL HIGH (ref 0–20)

## 2023-04-02 MED ORDER — SPIRONOLACTONE 25 MG PO TABS
100.0000 mg | ORAL_TABLET | Freq: Once | ORAL | Status: AC
Start: 2023-04-02 — End: 2023-04-02
  Administered 2023-04-02: 100 mg via ORAL

## 2023-04-02 MED ORDER — SPIRONOLACTONE 100 MG PO TABS: 100.0000 mg | ORAL_TABLET | Freq: Every day | ORAL | 0 refills | Status: DC

## 2023-04-02 MED ORDER — SPIRONOLACTONE 25 MG PO TABS
100.0000 mg | ORAL_TABLET | Freq: Once | ORAL | Status: DC
  Filled 2023-04-02: qty 4

## 2023-04-02 NOTE — Discharge Instructions (Addendum)
Go to the emergency department for evaluation of the swelling in your hands and feet that has gotten worse over the last 48 hours.

## 2023-04-02 NOTE — ED Triage Notes (Signed)
Patient to Urgent Care with complaints of finger swelling and toe swelling. Symptoms x1 week. Reports symptoms started after she stopped taking spironolactone (no refills).   Med was prescribed for HS via dermatology.

## 2023-04-02 NOTE — ED Provider Notes (Signed)
[name]    CSN: [id number] Arrival date & time: 04/02/23  0902      History   Chief Complaint Chief Complaint  Patient presents with   Hand Problem    Fingers swollen on both hands - Entered by patient    HPI Nancy Mcgrath is a 43 y.o. female.  Patient presents with progressively worse swelling in her hands and feet x 1 week.  Her symptoms started after she ran out of spironolactone which was prescribed by a dermatologist.  The swelling in her hands has gotten worse, especially over the last 48 hours and she now has redness and some of the fingers.  She noticed her toes were swelling also this morning.  She denies shortness of breath, cough, chest pain, fever.  She has an appointment on 04/05/2023 to establish with a PCP.  Patient had an e-visit on 03/31/2023; diagnosed with acute cystitis, dysuria, frequency of urination, antibiotic induced yeast infection; treated with cephalexin, Pyridium, Diflucan.  The history is provided by the patient and medical records.    Past Medical History:  Diagnosis Date   Anxiety    Depression    Hypertension     Patient Active Problem List   Diagnosis Date Noted   Essential hypertension 07/14/2016   Anxiety and depression 09/02/2015    History reviewed. No pertinent surgical history.  OB History   No obstetric history on file.      Home Medications    Prior to Admission medications   Medication Sig Start Date End Date Taking? Authorizing Provider  cephALEXin (KEFLEX) 500 MG capsule Take by mouth. 03/31/23 04/07/23 Yes [provider]  fluconazole (DIFLUCAN) 150 MG tablet Take 1 tablet x 1; may repeat in 3-4 days. 03/31/23  Yes [provider]  spironolactone (ALDACTONE) 100 MG tablet Take by mouth. 10/08/21  Yes [provider]  ALPRAZolam Prudy Feeler) 0.5 MG tablet Take 0.5 mg by mouth at bedtime as needed for anxiety.    [provider]  amLODipine-benazepril (LOTREL) 10-20 MG capsule Take 1  capsule daily by mouth. MUST SCHEDULE OFFICE VISIT FOR REFILLS 01/18/17   Lorre Munroe, NP  cetirizine (ZYRTEC) 10 MG tablet Take 1 tablet (10 mg total) by mouth daily. 12/14/16   Lorre Munroe, NP  escitalopram (LEXAPRO) 20 MG tablet Take 40 mg by mouth at bedtime.  Patient not taking: Reported on 04/02/2023 07/17/16   Lorre Munroe, NP  ibuprofen (ADVIL,MOTRIN) 600 MG tablet Take 1 tablet (600 mg total) by mouth every 8 (eight) hours as needed. Patient taking differently: Take 600 mg by mouth every 8 (eight) hours as needed for mild pain.  12/14/16   Lorre Munroe, NP    Family History Family History  Problem Relation Age of Onset   Diabetes Paternal Grandmother    Cancer Neg Hx    Heart disease Neg Hx    Stroke Neg Hx     Social History Social History   Tobacco Use   Smoking status: Former    Current packs/day: 1.00    Average packs/day: 1 pack/day for 15.0 years (15.0 ttl pk-yrs)    Types: Cigarettes   Smokeless tobacco: Never  Vaping Use   Vaping status: Every Day  Substance Use Topics   Alcohol use: No   Drug use: Not Currently    Types: Marijuana     Allergies   Patient has no known allergies.   Review of Systems Review of Systems  Constitutional:  Negative  for chills and fever.  Respiratory:  Negative for cough and shortness of breath.   Cardiovascular:  Negative for chest pain and palpitations.  Musculoskeletal:  Positive for arthralgias and joint swelling.  Skin:  Positive for color change. Negative for wound.  Neurological:  Negative for weakness and numbness.     Physical Exam Triage Vital Signs ED Triage Vitals  Encounter Vitals Group     BP 04/02/23 0936 121/85     Systolic BP Percentile --      Diastolic BP Percentile --      Pulse --      Resp --      Temp --      Temp src --      SpO2 --      Weight --      Height --      Head Circumference --      Peak Flow --      Pain Score 04/02/23 0934 7     Pain Loc --      Pain Education  --      Exclude from Growth Chart --    No data found.  Updated Vital Signs BP 121/85   Pulse 100   Temp 98.9 F (37.2 C)   Resp 18   LMP 03/15/2023   SpO2 97%   Visual Acuity Right Eye Distance:   Left Eye Distance:   Bilateral Distance:    Right Eye Near:   Left Eye Near:    Bilateral Near:     Physical Exam Constitutional:      General: She is not in acute distress. HENT:     Mouth/Throat:     Mouth: Mucous membranes are moist.  Cardiovascular:     Rate and Rhythm: Normal rate and regular rhythm.  Pulmonary:     Effort: Pulmonary effort is normal. No respiratory distress.  Musculoskeletal:        General: Swelling and tenderness present. No deformity.     Comments: Bilateral finger edematous with mild erythema and tenderness.  ROM limited by swelling.  Mild edema of right toes.  Skin:    General: Skin is warm and dry.     Capillary Refill: Capillary refill takes less than 2 seconds.     Findings: Erythema present. No lesion.  Neurological:     General: No focal deficit present.     Mental Status: She is alert and oriented to person, place, and time.     Sensory: No sensory deficit.     Motor: No weakness.      UC Treatments / Results  Labs (all labs ordered are listed, but only abnormal results are displayed) Labs Reviewed - No data to display  EKG   Radiology No results found.  Procedures Procedures (including critical care time)  Medications Ordered in UC Medications - No data to display  Initial Impression / Assessment and Plan / UC Course  I have reviewed the triage vital signs and the nursing notes.  Pertinent labs & imaging results that were available during my care of the patient were reviewed by me and considered in my medical decision making (see chart for details).   Swelling of hands and feet.  Patient reports the swelling has gotten progressively worse over the last 48 hours.  It started when she ran out of spironolactone that was  prescribed by her dermatologist who she no longer sees.  She has an appointment to establish a PCP on Monday.  No  lab work in her chart since 2022.  No chest pain or shortness of breath.  Afebrile and vital signs are stable.  Discussed limitations of evaluation of her symptoms in an urgent care setting.  Transferring her to the ED for evaluation.  She is agreeable to this.  Final Clinical Impressions(s) / UC Diagnoses   Final diagnoses:  Bilateral hand swelling  Bilateral swelling of feet     Discharge Instructions      Go to the emergency department for evaluation of the swelling in your hands and feet that has gotten worse over the last 48 hours.     ED Prescriptions   None    PDMP not reviewed this encounter.   Mickie Bail, NP 04/02/23 1005

## 2023-04-02 NOTE — ED Provider Notes (Signed)
[facility] Provider Note    Event Date/Time   First MD Initiated Contact with Patient 04/02/23 1133     (approximate)   History   hand swelling   HPI  Nancy Mcgrath is a 43 y.o. female with history of hypertension, at bedtime, presents emergency department complaint of swelling in the fingers and toes.  Patient had been on spironolactone for over a year and ran out about a week ago.  Started having swelling in the fingers and now in the toes.  Went to urgent care and was sent here to the ED due to the swelling.  She denies chest pain/shortness of breath.  No numbness or tingling.  No known injuries.      Physical Exam   Triage Vital Signs: ED Triage Vitals  Encounter Vitals Group     BP 04/02/23 1046 113/82     Systolic BP Percentile --      Diastolic BP Percentile --      Pulse Rate 04/02/23 1046 (!) 102     Resp 04/02/23 1046 17     Temp 04/02/23 1046 99.3 F (37.4 C)     Temp Source 04/02/23 1046 Oral     SpO2 04/02/23 1046 100 %     Weight 04/02/23 1047 178 lb (80.7 kg)     Height 04/02/23 1047 5\' 2"  (1.575 m)     Head Circumference --      Peak Flow --      Pain Score 04/02/23 1050 7     Pain Loc --      Pain Education --      Exclude from Growth Chart --     Most recent vital signs: Vitals:   04/02/23 1046  BP: 113/82  Pulse: (!) 102  Resp: 17  Temp: 99.3 F (37.4 C)  SpO2: 100%     General: Awake, no distress.   CV:  Good peripheral perfusion. regular rate and  rhythm Resp:  Normal effort. Lungs CTA Abd:  No distention.   Other:  Fingers are swollen, toes are swollen, no pitting edema on the lower extremities, full range of motion of fingers and toes, neurovascular is intact   ED Results / Procedures / Treatments   Labs (all labs ordered are listed, but only abnormal results are displayed) Labs Reviewed  BASIC METABOLIC PANEL - Abnormal; Notable for the following components:      Result Value   CO2 21 (*)     Glucose, Bld 114 (*)    All other components within normal limits  SEDIMENTATION RATE - Abnormal; Notable for the following components:   Sed Rate 29 (*)    All other components within normal limits  BRAIN NATRIURETIC PEPTIDE  CBC WITH DIFFERENTIAL/PLATELET     EKG     RADIOLOGY     PROCEDURES:   Procedures Chief Complaint  Patient presents with   hand swelling      MEDICATIONS ORDERED IN ED: Medications  spironolactone (ALDACTONE) tablet 100 mg (has no administration in time range)     IMPRESSION / MDM / ASSESSMENT AND PLAN / ED COURSE  I reviewed the triage vital signs and the nursing notes.                              Differential diagnosis includes, but is not limited to, withdrawal with spironolactone, edema, CHF, rheumatoid arthritis  Patient's presentation is most consistent with  acute illness / injury with system symptoms.   I feel that the patient's symptoms are probably caused by her withdrawing from spironolactone.  Will give her a dose of her spironolactone here in the emergency department.  Basic labs and sed rate ordered along with a BMP to ensure she does not have congestive heart failure.   Patient was given a 1 month refill of her spironolactone.  She is to follow-up with her doctor for already scheduled appointment.  Return for worsening.  She is in agreement treatment plan.  All lab values were discussed with patient.   FINAL CLINICAL IMPRESSION(S) / ED DIAGNOSES   Final diagnoses:  Adverse effect of spironolactone, initial encounter     Rx / DC Orders   ED Discharge Orders          Ordered    spironolactone (ALDACTONE) 100 MG tablet  Daily        04/02/23 1300             Note:  This document was prepared using Dragon voice recognition software and may include unintentional dictation errors.    Faythe Ghee, PA-C 04/02/23 1302    Concha Se, MD 04/04/23 [phone]

## 2023-04-02 NOTE — ED Notes (Signed)
Patient is being discharged from the Urgent Care and sent to the Emergency Department via POV . Per Wendee Beavers NP, patient is in need of higher level of care due to hand and feet sweeling. Patient is aware and verbalizes understanding of plan of care.  Vitals:   04/02/23 0936 04/02/23 0958  BP: 121/85   Pulse:  100  Resp:  18  Temp:  98.9 F (37.2 C)  SpO2:  97%

## 2023-04-02 NOTE — ED Triage Notes (Signed)
Pt comes with bilateral hand swelling and some itching. Pt states she was on spironolactone for her HS. Pt states she stopped it on last Tuesday and then the swelling started in one finger and then progressed. Pt states toes swollen also now.

## 2023-04-05 ENCOUNTER — Ambulatory Visit (INDEPENDENT_AMBULATORY_CARE_PROVIDER_SITE_OTHER): Payer: Medicare Other | Admitting: Family Medicine

## 2023-04-05 ENCOUNTER — Encounter: Payer: Self-pay | Admitting: Family Medicine

## 2023-04-05 ENCOUNTER — Ambulatory Visit: Payer: Medicare Other | Admitting: Family Medicine

## 2023-04-05 VITALS — BP 117/85 | HR 83 | Temp 98.5°F | Resp 18 | Ht 62.0 in | Wt 174.0 lb

## 2023-04-05 DIAGNOSIS — I10 Essential (primary) hypertension: Secondary | ICD-10-CM | POA: Diagnosis not present

## 2023-04-05 DIAGNOSIS — Z7689 Persons encountering health services in other specified circumstances: Secondary | ICD-10-CM

## 2023-04-05 DIAGNOSIS — Z1231 Encounter for screening mammogram for malignant neoplasm of breast: Secondary | ICD-10-CM

## 2023-04-05 DIAGNOSIS — Z0000 Encounter for general adult medical examination without abnormal findings: Secondary | ICD-10-CM

## 2023-04-05 DIAGNOSIS — D219 Benign neoplasm of connective and other soft tissue, unspecified: Secondary | ICD-10-CM | POA: Diagnosis not present

## 2023-04-05 DIAGNOSIS — F419 Anxiety disorder, unspecified: Secondary | ICD-10-CM | POA: Diagnosis not present

## 2023-04-05 MED ORDER — IBUPROFEN 600 MG PO TABS
600.0000 mg | ORAL_TABLET | Freq: Three times a day (TID) | ORAL | 0 refills | Status: AC | PRN
Start: 2023-04-05 — End: ?

## 2023-04-05 MED ORDER — SPIRONOLACTONE 100 MG PO TABS: 100.0000 mg | ORAL_TABLET | Freq: Every day | ORAL | 3 refills | Status: AC

## 2023-04-05 NOTE — Progress Notes (Unsigned)
New Patient Office Visit  Subjective   Patient ID: Nancy Mcgrath, female    DOB: [birth year]  Age: 43 y.o. MRN: [id number]  CC:  Chief Complaint  Patient presents with   Hypertension   Foot Swelling    Swelling of hands and feet   HPI Nancy Mcgrath is a 43 year old female who presents to establish with [facility] at [facility].  CC: Patient here to establish care  Last PCP: 2018 [facility] & 2022 [facility]: dermatologist   HTN- reports history but has not been taking prescribed amlodipine-benazepril 10-20mg  daily for "a while" Spironolactone from dermatologist, have been on this a little over a year Stopped taking it about a week ago and noticed swelling in her hands & went to ED 04/02/2023  HS & cystic acne- currently taking spirolactone Was being followed by derm, no longer seeing (?)  Mood- deals with anxiety more, has taken xanax in the past but not recently  Worse when she works  History of fibroids  She still gets pain but not as bad  Ibuprofen does help with the pain  Surgical history- RALS Myomectomy with Excision of Endometriosis with Ovarian Cystectomy, Chromopertubation, Cystoscopy      04/05/2023   11:25 AM  GAD 7 : Generalized Anxiety Score  Nervous, Anxious, on Edge 1  Control/stop worrying 1  Worry too much - different things 0  Trouble relaxing 0  Restless 0  Easily annoyed or irritable 1  Afraid - awful might happen 0  Total GAD 7 Score 3  Anxiety Difficulty Somewhat difficult       04/05/2023   11:23 AM 12/14/2016   12:41 PM 07/14/2016   10:56 AM  PHQ9 SCORE ONLY  PHQ-9 Total Score 7 0 0    Outpatient Encounter Medications as of 04/05/2023  Medication Sig   [DISCONTINUED] spironolactone (ALDACTONE) 100 MG tablet Take 1 tablet (100 mg total) by mouth daily.   ALPRAZolam (XANAX) 0.5 MG tablet Take 0.5 mg by mouth at bedtime as needed for anxiety. (Patient not taking: Reported on 04/05/2023)   cetirizine (ZYRTEC) 10 MG tablet  Take 1 tablet (10 mg total) by mouth daily. (Patient not taking: Reported on 04/05/2023)   fluconazole (DIFLUCAN) 150 MG tablet Take 1 tablet x 1; may repeat in 3-4 days. (Patient not taking: Reported on 04/05/2023)   ibuprofen (ADVIL,MOTRIN) 600 MG tablet Take 1 tablet (600 mg total) by mouth every 8 (eight) hours as needed. (Patient not taking: Reported on 04/05/2023)   spironolactone (ALDACTONE) 100 MG tablet Take 1 tablet (100 mg total) by mouth daily.   [DISCONTINUED] amLODipine-benazepril (LOTREL) 10-20 MG capsule Take 1 capsule daily by mouth. MUST SCHEDULE OFFICE VISIT FOR REFILLS (Patient not taking: Reported on 04/05/2023)   No facility-administered encounter medications on file as of 04/05/2023.    Patient Active Problem List   Diagnosis Date Noted   Essential hypertension 07/14/2016   Anxiety and depression 09/02/2015   Past Medical History:  Diagnosis Date   Anxiety    Depression    Hypertension    History reviewed. No pertinent surgical history. Family History  Problem Relation Age of Onset   Hypertension Father    Heart attack Father    Diabetes Paternal Grandmother    Breast cancer Maternal Aunt        dx age 33 (?)   Cancer Neg Hx    Heart disease Neg Hx    Stroke Neg Hx    Social History   Socioeconomic  History   Marital status: Single    Spouse name: Not on file   Number of children: Not on file   Years of education: Not on file   Highest education level: Not on file  Occupational History   Not on file  Tobacco Use   Smoking status: Former    Current packs/day: 1.00    Average packs/day: 1 pack/day for 15.0 years (15.0 ttl pk-yrs)    Types: Cigarettes    Passive exposure: Past   Smokeless tobacco: Never  Vaping Use   Vaping status: Every Day  Substance and Sexual Activity   Alcohol use: No   Drug use: Not Currently    Types: Marijuana   Sexual activity: Never  Other Topics Concern   Not on file  Social History Narrative   Not on file   Social  Drivers of Health   Financial Resource Strain: Low Risk  (02/18/2023)   Received from [facility]   Overall Financial Resource Strain (CARDIA)    Difficulty of Paying Living Expenses: Not hard at all  Food Insecurity: No Food Insecurity (02/18/2023)   [facility]   Hunger Vital Sign    Worried About Running Out of Food in the Last Year: Never true    Ran Out of Food in the Last Year: Never true  Transportation Needs: No Transportation Needs (02/18/2023)   Received from [facility] - Transportation    Lack of Transportation (Medical): No    Lack of Transportation (Non-Medical): No  Physical Activity: Not on file  Stress: Not on file  Social Connections: Socially Isolated (01/15/2020)   Received from [employer] and Isolation Panel [NHANES]    Frequency of Communication with Friends and Family: Once a week    Frequency of Social Gatherings with Friends and Family: Never    Attends Religious Services: Never    Database administrator or Organizations: No    Attends Engineer, structural: Never    Marital Status: Married  Catering manager Violence: Not on file   Outpatient Medications Prior to Visit  Medication Sig Dispense Refill   spironolactone (ALDACTONE) 100 MG tablet Take 1 tablet (100 mg total) by mouth daily. 30 tablet 0   ALPRAZolam (XANAX) 0.5 MG tablet Take 0.5 mg by mouth at bedtime as needed for anxiety. (Patient not taking: Reported on 04/05/2023)     cetirizine (ZYRTEC) 10 MG tablet Take 1 tablet (10 mg total) by mouth daily. (Patient not taking: Reported on 04/05/2023) 30 tablet 11   fluconazole (DIFLUCAN) 150 MG tablet Take 1 tablet x 1; may repeat in 3-4 days. (Patient not taking: Reported on 04/05/2023)     ibuprofen (ADVIL,MOTRIN) 600 MG tablet Take 1 tablet (600 mg total) by mouth every 8 (eight) hours as needed. (Patient not taking: Reported on 04/05/2023) 30 tablet 0   amLODipine-benazepril (LOTREL) 10-20 MG  capsule Take 1 capsule daily by mouth. MUST SCHEDULE OFFICE VISIT FOR REFILLS (Patient not taking: Reported on 04/05/2023) 30 capsule 0   No facility-administered medications prior to visit.   No Known Allergies  ROS: see HPI  Objective   Today's Vitals   04/05/23 1115  BP: 117/85  Pulse: 83  Resp: 18  Temp: 98.5 F (36.9 C)  TempSrc: Oral  SpO2: 98%  Weight: 174 lb (78.9 kg)  Height: 5\' 2"  (1.575 m)  PainSc: 2   PainLoc: Finger   GENERAL: Well-appearing, in NAD. Well nourished.  SKIN:  Pink, warm and dry. No rash, lesion, ulceration, or ecchymoses.  Head: Normocephalic. NECK: Trachea midline. Full ROM w/o pain or tenderness. No lymphadenopathy.  RESPIRATORY: Chest wall symmetrical. Respirations even and non-labored. Breath sounds clear to auscultation bilaterally.  CARDIAC: S1, S2 present, regular rate and rhythm without murmur or gallops. Peripheral pulses 2+ bilaterally.  MSK: Muscle tone and strength appropriate for age. Joints w/o tenderness, redness, or swelling.  EXTREMITIES: Without clubbing, cyanosis, or edema.  NEUROLOGIC: No motor or sensory deficits. Steady, even gait. C2-C12 intact.  PSYCH/MENTAL STATUS: Alert, oriented x 3. Cooperative, appropriate mood and affect.     Assessment & Plan:   1. Encounter to establish care (Primary) Patient is a 11- year-old female who presents today to establish care with primary care at [facility]. Reviewed the past medical history, family history, social history, surgical history, medications and allergies today- updates made as indicated.   2. Primary hypertension Review of chart- patient has not been seen for HTN since 2021. She was previously on amlodipine-benazepril 10-20mg  daily but is no longer taking this medication. She has been taking spironolactone 100mg  daily (prescribed by derm) for HS and cystic acne. Denies chest pain, shortness of breath, lower extremity edema, vision changes, headaches. Blood pressure well  controlled in office. Cardiovascular exam with heart regular rate and rhythm. Normal heart sounds, no murmurs present. No lower extremity edema present. Lungs clear to auscultation bilaterally. Continue with current medication regimen. Refills provided today.   3. Anxiety GAD7 completed today with score of 3. Patient reports an increase in her anxiety when she has a job and is not currently working.  PHQ9 completed with score of 7. Denies history of depression, SI/HI. Declines CBT and pharmacotherapy at this time.  4. Wellness examination Will complete fasting labs today and schedule CPE in near future.  - CBC with Differential/Platelet - Comprehensive metabolic panel - Hemoglobin A1c - Lipid panel - TSH Rfx on Abnormal to Free T4  5. Screening mammogram for breast cancer Order placed for breast cancer screening mammogram.  - MM 3D SCREENING MAMMOGRAM BILATERAL BREAST; Future   Return in about 8 weeks (around 05/31/2023) for Physical.   Alyson Reedy, FNP

## 2023-04-05 NOTE — Patient Instructions (Addendum)
Below are some facilities that offer counseling services.  Please call to arrange an appointment with a counselor:  Behavioral Health/Counseling Services:  [facility]  Phone: [phone] [address] [place], [place] [address]  [facility] Phone: [phone] Various locations   [facility]  Phone: [phone] [address], [address] [place], [place] [address]  [facility] [place]) Phone: [phone]  [facility] Phone: [phone] [address] [place], [place] [address]   Mental Health- Accepts Medicaid  (* = Spanish available;  + = Psychiatric services) * [facility] - Cl[facility]                            [phone]  Walk in 9am-1pm Virtual & Onsite  *+ [facility]:                                     [phone] or [phone] Virtual & Onsite  [facility]:                                              [phone] Virtual & Onsite  + [facility]:                                         [phone] Virtual & Onsite  * [facility]:                                                   [phone]   [facility]                                                    [phone] Virtual & Onsite  [facility]           [phone] Virtual   [facility]:                                                           [phone] Virtual & Onsite  [facility]:                                      [phone] Virtual & Onsite  [facility]:                            [phone]   *[facility]  [phone] Virtual & Onsite  + [facility]:             [phone] or [phone]   [facility]                                                  [phone] Virtual & Onsite   _________________________________________________________________________  Bonita Quin can also visit PsychologyToday.com to find a counselor that is within your insurance's network.  Website: https://www.psychologytoday.com/us/therapists   If you need immediate emotional support, reach out to the national mental health hotline: 988. Whether you're facing mental health struggles, emotional distress, alcohol or drug use concerns, or just need someone to talk to, our caring counselors are here for you. You are not alone.   MyChart:  For all urgent or time sensitive needs we ask that you please call the office to avoid delays. Our number is [phone]. MyChart is not constantly monitored and due to the large volume of messages a day, replies may take up to 72 business hours.   MyChart Policy: MyChart allows for you to see your visit notes, after visit summary, provider recommendations, lab and tests results, make an appointment, request refills, and contact your provider or the office for non-urgent questions or concerns. Providers are seeing patients during normal business hours and do not have built in time to review MyChart messages.  We ask that you allow a minimum of 3 business days for responses to [employer]. For this reason, please do not send urgent requests through MyChart. Please call the office at [phone]. New and ongoing conditions may require a visit. We have virtual and in person visit available for your convenience.  Complex MyChart concerns may require a visit. Your provider may request you schedule a virtual or in person visit to ensure we are providing the best care possible. MyChart messages sent after 11:00 AM on Friday will not be received by the provider until Monday morning.    Lab and Test Results: You will receive your lab and test results on MyChart as soon as they are completed and results have been sent by the lab or testing  facility. Due to this service, you will receive your results BEFORE your provider.  I review lab and tests results each morning prior to seeing patients. Some results require collaboration with other providers to ensure you are receiving the most appropriate care. For this reason, we ask that you please allow a minimum of 3-5 business days from the time the ALL results have been received for your provider to receive and review lab and test results and contact you about these.  Most lab and test result comments from the provider will be sent through MyChart. Your provider may recommend changes to the plan of care, follow-up visits, repeat testing, ask questions, or request an office visit to discuss these results. You may reply directly to this message or call the office at [phone] to provide information for the provider or set up an appointment. In some instances, you will be called with test results and recommendations. Please let us know if this is preferred and we will make note of this in your chart to provide this for you.    If you have not heard a response to your lab or test results in 5 business days from all results returning  to MyChart, please call the office to let us know. We ask that you please avoid calling prior to this time unless there is an emergent concern. Due to high call volumes, this can delay the resulting process.   After Hours: For all non-emergency after hours needs, please call the office at [phone] and select the option to reach the on-call provider service. On-call services are shared between multiple [place] offices and therefore it will not be possible to speak directly with your provider. On-call providers may provide medical advice and recommendations, but are unable to provide refills for maintenance medications.  For all emergency or urgent medical needs after normal business hours, we recommend that you seek care at the closest Urgent Care or Emergency  Department to ensure appropriate treatment in a timely manner.  [facility] has a 24 hour emergency room located on the ground floor for your convenience.    Urgent Concerns During the Business Day Providers are seeing patients from 8AM to 5PM, Monday through Thursday, and 8AM to 12PM on Friday with a busy schedule and are most often not able to respond to non-urgent calls until the end of the day or the next business day. If you should have URGENT concerns during the day, please call and speak to the nurse or schedule a same day appointment so that we can address your concern without delay.    Thank you, again, for choosing me as your health care partner. I appreciate your trust and look forward to learning more about you.    Alyson Reedy, FNP-C

## 2023-04-06 LAB — CBC WITH DIFFERENTIAL/PLATELET
Basophils Absolute: 0 10*3/uL (ref 0.0–0.2)
Basos: 0 %
EOS (ABSOLUTE): 0.1 10*3/uL (ref 0.0–0.4)
Eos: 1 %
Hematocrit: 41.4 % (ref 34.0–46.6)
Hemoglobin: 13.2 g/dL (ref 11.1–15.9)
Immature Grans (Abs): 0 10*3/uL (ref 0.0–0.1)
Immature Granulocytes: 0 %
Lymphocytes Absolute: 2.7 10*3/uL (ref 0.7–3.1)
Lymphs: 48 %
MCH: 29.3 pg (ref 26.6–33.0)
MCHC: 31.9 g/dL (ref 31.5–35.7)
MCV: 92 fL (ref 79–97)
Monocytes Absolute: 0.2 10*3/uL (ref 0.1–0.9)
Monocytes: 4 %
Neutrophils Absolute: 2.7 10*3/uL (ref 1.4–7.0)
Neutrophils: 47 %
Platelets: 303 10*3/uL (ref 150–450)
RBC: 4.51 x10E6/uL (ref 3.77–5.28)
RDW: 12 % (ref 11.7–15.4)
WBC: 5.8 10*3/uL (ref 3.4–10.8)

## 2023-04-06 LAB — COMPREHENSIVE METABOLIC PANEL
ALT: 9 [IU]/L (ref 0–32)
AST: 14 [IU]/L (ref 0–40)
Albumin: 4.5 g/dL (ref 3.9–4.9)
Alkaline Phosphatase: 42 [IU]/L — ABNORMAL LOW (ref 44–121)
BUN/Creatinine Ratio: 8 — ABNORMAL LOW (ref 9–23)
BUN: 9 mg/dL (ref 6–24)
Bilirubin Total: 0.3 mg/dL (ref 0.0–1.2)
CO2: 24 mmol/L (ref 20–29)
Calcium: 10.1 mg/dL (ref 8.7–10.2)
Chloride: 102 mmol/L (ref 96–106)
Creatinine, Ser: 1.16 mg/dL — ABNORMAL HIGH (ref 0.57–1.00)
Globulin, Total: 2.5 g/dL (ref 1.5–4.5)
Glucose: 92 mg/dL (ref 70–99)
Potassium: 4.3 mmol/L (ref 3.5–5.2)
Sodium: 139 mmol/L (ref 134–144)
Total Protein: 7 g/dL (ref 6.0–8.5)
eGFR: 61 mL/min/{1.73_m2} (ref >59)

## 2023-04-06 LAB — LIPID PANEL
Chol/HDL Ratio: 4.4 {ratio} (ref 0.0–4.4)
Cholesterol, Total: 197 mg/dL (ref 100–199)
HDL: 45 mg/dL (ref >39)
LDL Chol Calc (NIH): 141 mg/dL — ABNORMAL HIGH (ref 0–99)
Triglycerides: 60 mg/dL (ref 0–149)
VLDL Cholesterol Cal: 11 mg/dL (ref 5–40)

## 2023-04-06 LAB — HEMOGLOBIN A1C
Est. average glucose Bld gHb Est-mCnc: 111 mg/dL
Hgb A1c MFr Bld: 5.5 % (ref 4.8–5.6)

## 2023-04-06 LAB — TSH RFX ON ABNORMAL TO FREE T4: TSH: 0.8 u[IU]/mL (ref 0.450–4.500)

## 2023-04-08 ENCOUNTER — Encounter: Payer: Self-pay | Admitting: Family Medicine

## 2023-05-04 ENCOUNTER — Ambulatory Visit: Payer: Medicare Other

## 2023-05-31 ENCOUNTER — Ambulatory Visit: Payer: Medicare Other | Admitting: Family Medicine

## 2023-10-04 ENCOUNTER — Telehealth: Payer: Self-pay

## 2023-10-04 NOTE — Telephone Encounter (Signed)
 Attempted to contact patient to schedule Annual Medicare Wellness Visit. Left a message to call and schedule AWV when able. Sent mychart message as well.
